# Patient Record
Sex: Female | Born: 1955
Health system: Southern US, Community
[De-identification: ages and names within clinical notes are randomized; demographics above are authoritative.]

## PROBLEM LIST (undated history)

## (undated) DIAGNOSIS — I10 Essential (primary) hypertension: Secondary | ICD-10-CM

## (undated) DIAGNOSIS — E119 Type 2 diabetes mellitus without complications: Secondary | ICD-10-CM

## (undated) DIAGNOSIS — F32A Depression, unspecified: Secondary | ICD-10-CM

## (undated) DIAGNOSIS — M1990 Unspecified osteoarthritis, unspecified site: Secondary | ICD-10-CM

## (undated) DIAGNOSIS — K219 Gastro-esophageal reflux disease without esophagitis: Secondary | ICD-10-CM

## (undated) DIAGNOSIS — E669 Obesity, unspecified: Secondary | ICD-10-CM

## (undated) DIAGNOSIS — J45909 Unspecified asthma, uncomplicated: Secondary | ICD-10-CM

## (undated) DIAGNOSIS — G4730 Sleep apnea, unspecified: Secondary | ICD-10-CM

## (undated) DIAGNOSIS — F419 Anxiety disorder, unspecified: Secondary | ICD-10-CM

## (undated) HISTORY — PX: SALPINGECTOMY: SHX328

## (undated) HISTORY — DX: Depression, unspecified: F32.A

## (undated) HISTORY — PX: LAPAROSCOPIC GASTRIC BANDING: SHX1100

## (undated) HISTORY — DX: Obesity, unspecified: E66.9

## (undated) HISTORY — DX: Type 2 diabetes mellitus without complications: E11.9

## (undated) HISTORY — DX: Gastro-esophageal reflux disease without esophagitis: K21.9

## (undated) HISTORY — PX: OTHER SURGICAL HISTORY: SHX169

## (undated) HISTORY — DX: Anxiety disorder, unspecified: F41.9

## (undated) HISTORY — PX: LEEP: SHX91

## (undated) HISTORY — DX: Unspecified osteoarthritis, unspecified site: M19.90

---

## 1999-08-13 ENCOUNTER — Encounter: Payer: Self-pay | Admitting: *Deleted

## 1999-08-13 ENCOUNTER — Ambulatory Visit (HOSPITAL_COMMUNITY): Admission: RE | Admit: 1999-08-13 | Discharge: 1999-08-13 | Payer: Self-pay | Admitting: *Deleted

## 2000-10-19 ENCOUNTER — Encounter: Payer: Self-pay | Admitting: Obstetrics and Gynecology

## 2000-10-19 ENCOUNTER — Inpatient Hospital Stay (HOSPITAL_COMMUNITY): Admission: AD | Admit: 2000-10-19 | Discharge: 2000-10-19 | Payer: Self-pay | Admitting: Obstetrics and Gynecology

## 2000-10-25 ENCOUNTER — Inpatient Hospital Stay (HOSPITAL_COMMUNITY): Admission: AD | Admit: 2000-10-25 | Discharge: 2000-10-25 | Payer: Self-pay | Admitting: Obstetrics and Gynecology

## 2000-10-29 ENCOUNTER — Encounter (INDEPENDENT_AMBULATORY_CARE_PROVIDER_SITE_OTHER): Payer: Self-pay | Admitting: Specialist

## 2000-10-29 ENCOUNTER — Inpatient Hospital Stay (HOSPITAL_COMMUNITY): Admission: AD | Admit: 2000-10-29 | Discharge: 2000-10-29 | Payer: Self-pay | Admitting: Obstetrics & Gynecology

## 2000-10-29 ENCOUNTER — Encounter: Payer: Self-pay | Admitting: Obstetrics and Gynecology

## 2000-10-29 ENCOUNTER — Ambulatory Visit (HOSPITAL_COMMUNITY): Admission: AD | Admit: 2000-10-29 | Discharge: 2000-10-29 | Payer: Self-pay | Admitting: Obstetrics and Gynecology

## 2011-04-29 NOTE — Op Note (Signed)
[facility]  Patient:    Monica Duarte, Monica Duarte                        MRN: [id number] Proc. Date: 10/29/00 Adm. Date:  [id number] Attending:  Trevor Iha                           Operative Report  PREOPERATIVE DIAGNOSIS:       Probable right ectopic pregnancy.  POSTOPERATIVE DIAGNOSIS:      Probable right ectopic pregnancy. Pelvic adhesive disease.  OPERATION:                    Laparoscopy with right salpingectomy and lysis of adhesions.  SURGEON:                      Trevor Iha, M.D.  ANESTHESIA:                   General endotracheal.  INDICATIONS:                  Ms. Monica Duarte is a 55 year old G2, P1 with known ectopic on the right. Received methotrexate twice, was diagnosed originally on October 15, 2000. Patient began sudden onset of pain approximately 02:30 this morning. She was seen in the emergency room and sent home. She presented again today about 12 noon with worsening pain on the right side, sudden and sharp. She also had vaginal bleeding. Hemoglobin was stable at 12.1 at triage. Beta HCG today was 7462 which is down since yesterday of 8600; however, because of the presentation on what ultrasound which showed a moderate amount of fluid in the cul-de-sac consistent with a probable right ectopic rupture. Planned laparoscopic evaluation and/or treatment of the probable right ectopic. Risks and benefits were discussed at length. Informed consent was obtained.  FINDINGS:                     The findings at the time of surgery were approximately 200 cc of blood within the abdomen, a right ectopic pregnancy which had ruptured, and also scar tissue from the appendix to the right ectopic. Normal-appearing left tube and ovary and uterus and normal- appearing gallbladder and liver.  DESCRIPTION OF PROCEDURE:     After adequate anesthesia the patient was placed in the dorsal lithotomy position. She was sterilely prepped and draped.  A one-cm infraumbilical skin incision was made. Voorhees needle was inserted. The abdomen was instillated to dullness percussion. Two 5-mm trocars were inserted to the left and right of the midline two fingerbreadths above the pubis symphysis under direct visualization. The above findings were noted. Because of the extensiveness of the tube and ectopic, after evacuation of large amount of clots, lysis of adhesions was performed using Endoshears with good hemostasis achieved. The appendix appeared to be normal after removal from the right tubo-ovarian complex. Because of the adhesions and the extensive nature of the ectopic, it was decided that right salpingectomy would be performed. The left tube did appear to be normal at this time. Bipolar cautery was used to cauterize across the fallopian tube, across the mesosalpinx with care taken to avoid left infudibulopelvic ligament and ovary and bowel. After careful dissection the right tube and ectopic were moved. Good hemostasis was noted. After copious amount of irrigation adequate hemostasis was achieved. Rexamination of the right mesosalpinx and right tube  showed good hemostasis. The appendix appeared to be normal. The tube was fragmented and removed through the 12-mm trocar. At this time the laparoscope was removed. The trocar sites were closed with 0 Vicryl in the infraumbilical skin incision and 3-0 Vicryl Rapide in subcuticular fashion. The 5-mm ports were closed with 3-0 Vicryl Rapide in subcuticular fashion. They were injected with 0.25% Marcaine. Tenaculum is removed from the cervix, noted to be hemostatic.  The patient tolerated the procedure well with stable transfer to recovery room. Sponge and instrument count was normal x 3.  DISPOSITION:                  Patient to be discharged home with a routine instruction sheet for laparoscopy. She is told to return for increased pain, bleeding, or fever. Otherwise, she will follow-up in  the office in two to three weeks. DD:  10/29/00 TD:  10/29/00 Job: [id number]

## 2012-01-16 ENCOUNTER — Ambulatory Visit (INDEPENDENT_AMBULATORY_CARE_PROVIDER_SITE_OTHER): Payer: 59 | Admitting: Internal Medicine

## 2012-01-16 VITALS — BP 108/78 | HR 65 | Temp 97.9°F | Resp 16 | Ht 66.0 in | Wt 238.0 lb

## 2012-01-16 DIAGNOSIS — M545 Low back pain: Secondary | ICD-10-CM

## 2012-01-16 DIAGNOSIS — N72 Inflammatory disease of cervix uteri: Secondary | ICD-10-CM

## 2012-01-16 DIAGNOSIS — N760 Acute vaginitis: Secondary | ICD-10-CM

## 2012-01-16 LAB — POCT UA - MICROSCOPIC ONLY
Bacteria, U Microscopic: NEGATIVE
Casts, Ur, LPF, POC: NEGATIVE
Crystals, Ur, HPF, POC: NEGATIVE
Mucus, UA: NEGATIVE
Yeast, UA: NEGATIVE

## 2012-01-16 LAB — POCT URINALYSIS DIPSTICK
Bilirubin, UA: NEGATIVE
Glucose, UA: NEGATIVE
Ketones, UA: NEGATIVE
Nitrite, UA: NEGATIVE
Spec Grav, UA: 1.02
Urobilinogen, UA: 0.2
pH, UA: 5.5

## 2012-01-16 LAB — POCT WET PREP WITH KOH
KOH Prep POC: NEGATIVE
RBC Wet Prep HPF POC: NEGATIVE
Trichomonas, UA: NEGATIVE
Yeast Wet Prep HPF POC: NEGATIVE

## 2012-01-16 LAB — POCT URINE PREGNANCY: Preg Test, Ur: NEGATIVE

## 2012-01-16 MED ORDER — MELOXICAM 7.5 MG PO TABS: 7.5000 mg | ORAL_TABLET | Freq: Every day | ORAL | Status: DC

## 2012-01-16 NOTE — Progress Notes (Signed)
  Subjective:    Patient ID: Monica Duarte, female    DOB: [birth year], 56 y.o.   MRN: [id number]  Vaginal Discharge The patient's primary symptoms include a vaginal discharge. This is a new problem. The current episode started yesterday. The problem occurs intermittently. The problem has been unchanged. The patient is experiencing no pain. She is not pregnant. Associated symptoms include back pain. Pertinent negatives include no abdominal pain, anorexia, nausea, painful intercourse or urgency. The vaginal discharge was yellow. The symptoms are aggravated by nothing. She has tried nothing for the symptoms. It is unknown whether or not her partner has an STD. She uses condoms for contraception. Her menstrual history has been regular. Her past medical history is significant for an STD.  Monica Duarte also has intermittent low back pain, this is not related to her vaginal discharge complaint today.  Usually it is relieved with 800 mg Motrin.  She had L/S films last January at [facility].    Review of Systems  Gastrointestinal: Negative for nausea, abdominal pain and anorexia.  Genitourinary: Positive for vaginal discharge. Negative for urgency.  Musculoskeletal: Positive for back pain.       Objective:   Physical Exam  Constitutional: She is oriented to person, place, and time. She appears well-developed and well-nourished.  HENT:  Head: Normocephalic.  Eyes: Conjunctivae are normal.  Neck: Neck supple.  Cardiovascular: Normal rate, regular rhythm and normal heart sounds.   Pulmonary/Chest: Effort normal and breath sounds normal.  Musculoskeletal: Normal range of motion. She exhibits no edema and no tenderness.       No paraspinal muscle tenderness in her back.  Neurological: She is alert and oriented to person, place, and time. She displays normal reflexes.  Skin: Skin is warm and dry.  Psychiatric: She has a normal mood and affect.          Assessment & Plan:  Low back strain likely  secondary to chronic MS strain/body mechanics.  Continue weight loss efforts.  Restart back exercises from booklet given by A. McClung several months ago.  Meloxicam prn for pain.  No sign of urinary or bacterial infections today.

## 2012-01-16 NOTE — Patient Instructions (Signed)
Take meloxicam for back discomfort, restart back exercises for strengthening.  If back pain persists will refer to orthopedics/PT.  RTC prn.

## 2012-01-26 ENCOUNTER — Telehealth: Payer: Self-pay

## 2012-01-26 NOTE — Telephone Encounter (Signed)
Pt states she would like to speak with [place] ONLY about recent ov. States she saw diff. Provider recently who 'disagreed with what angela talked about and would not refill rx for diet pills'.  Please call: 2058/08/20 bf

## 2012-01-26 NOTE — Telephone Encounter (Signed)
Please get me this patients chart to go with the msg. Thanks, Doris Mcgilvery

## 2012-01-27 NOTE — Telephone Encounter (Signed)
Monica Duarte, Chart in your box

## 2012-01-27 NOTE — Telephone Encounter (Signed)
Please call patient and tell her I am no longer prescribing this medication.  Thanks, angela

## 2012-01-28 NOTE — Telephone Encounter (Signed)
[phone] 

## 2012-01-28 NOTE — Telephone Encounter (Signed)
Spoke with pt and she would like to know why this med cannot be prescribed anymore. She would like Monica Duarte to call her back and if she is not available she can leave message on her VM. Call pt at

## 2012-02-03 NOTE — Telephone Encounter (Signed)
Monica Duarte, it looks like this needs to be routed to you

## 2012-04-16 ENCOUNTER — Encounter (HOSPITAL_COMMUNITY): Payer: Self-pay | Admitting: *Deleted

## 2012-04-16 ENCOUNTER — Emergency Department (HOSPITAL_COMMUNITY)
Admission: EM | Admit: 2012-04-16 | Discharge: 2012-04-16 | Disposition: A | Discharge status: Home / Self Care | Payer: 59 | Source: Home / Self Care | Attending: Emergency Medicine | Admitting: Emergency Medicine

## 2012-04-16 DIAGNOSIS — T192XXA Foreign body in vulva and vagina, initial encounter: Secondary | ICD-10-CM

## 2012-04-16 LAB — POCT URINALYSIS DIP (DEVICE)
Nitrite: NEGATIVE
Protein, ur: NEGATIVE mg/dL
Specific Gravity, Urine: 1.015 (ref 1.005–1.030)
Urobilinogen, UA: 0.2 mg/dL (ref 0.0–1.0)
pH: 6 (ref 5.0–8.0)

## 2012-04-16 MED ORDER — AZITHROMYCIN 250 MG PO TABS
1000.0000 mg | ORAL_TABLET | Freq: Once | ORAL | Status: AC
  Administered 2012-04-16: 1000 mg via ORAL

## 2012-04-16 MED ORDER — CEFTRIAXONE SODIUM 250 MG IJ SOLR
250.0000 mg | Freq: Once | INTRAMUSCULAR | Status: AC
  Administered 2012-04-16: 250 mg via INTRAMUSCULAR

## 2012-04-16 MED ORDER — AZITHROMYCIN 250 MG PO TABS
ORAL_TABLET | ORAL | Status: AC
  Filled 2012-04-16: qty 4

## 2012-04-16 MED ORDER — CEFTRIAXONE SODIUM 250 MG IJ SOLR
INTRAMUSCULAR | Status: AC
  Filled 2012-04-16: qty 250

## 2012-04-16 NOTE — ED Provider Notes (Signed)
History     CSN: [id number]  Arrival date & time 04/16/12  1254   First MD Initiated Contact with Patient 04/16/12 1258      Chief Complaint  Patient presents with  . Vaginal Discharge    (Consider location/radiation/quality/duration/timing/severity/associated sxs/prior treatment) HPI Comments: Pt with several days oderous, brownish vaginal discharge. Reports vaginal bleeding post intercourse last night. She has been using tampons for the past 10 days, but states that she is taking them out regularly. She used a tampon after she started bleeding last night, but states she took about before being evaluated today. No urgency, frequency, dysuria, oderous urine, hematuria,  genital blisters, vaginal itching. No fevers, N/V, abd pain, back pain. No rash, presyncope, syncope. No recent abx use. Pt sexually active with same female partner who is asxatic. Does use does not use condoms. STD's a concern today. Has history of trichomonas. No history of  BV gonorrhea chlamydia  yeast infection. No h/o syphilis, herpes, HIV.  ROS as noted in HPI. All other ROS negative.   Patient is a 56 y.o. female presenting with vaginal discharge. The history is provided by the patient. No language interpreter was used.  Vaginal Discharge This is a new problem. The current episode started more than 2 days ago. The problem occurs constantly. The problem has not changed since onset.Pertinent negatives include no abdominal pain. The symptoms are aggravated by intercourse. The symptoms are relieved by nothing. She has tried nothing for the symptoms. The treatment provided no relief.    Past Medical History  Diagnosis Date  . Obesity   . Trichomonal vaginitis     Past Surgical History  Procedure Date  . Laparoscopic gastric banding     2009    History reviewed. No pertinent family history.  History  Substance Use Topics  . Smoking status: Never Smoker   . Smokeless tobacco: Not on file  . Alcohol Use: Yes     OB History    Grav Para Term Preterm Abortions TAB SAB Ect Mult Living                  Review of Systems  Gastrointestinal: Negative for abdominal pain.  Genitourinary: Positive for vaginal discharge.    Allergies  Sulfa antibiotics  Home Medications   Current Outpatient Rx  Name Route Sig Dispense Refill  . MELOXICAM 7.5 MG PO TABS Oral Take 1 tablet (7.5 mg total) by mouth daily. 60 tablet 0  . MULTIVITAMINS PO CAPS Oral Take 1 capsule by mouth daily.      BP 118/53  Pulse 78  Temp(Src) 98.5 F (36.9 C) (Oral)  Resp 20  SpO2 100%  LMP 03/31/2012  Physical Exam  Nursing note and vitals reviewed. Constitutional: She is oriented to person, place, and time. She appears well-developed and well-nourished. No distress.  HENT:  Head: Normocephalic and atraumatic.  Eyes: EOM are normal.  Neck: Normal range of motion.  Cardiovascular: Normal rate.   Pulmonary/Chest: Effort normal.  Abdominal: Soft. Bowel sounds are normal. She exhibits no distension. There is no tenderness. There is no rebound and no guarding.  Genitourinary: Uterus normal. Pelvic exam was performed with patient supine. There is no rash on the right labia. There is no rash on the left labia. Uterus is not tender. Cervix exhibits no motion tenderness and no friability. Right adnexum displays no mass, no tenderness and no fullness. Left adnexum displays no mass, no tenderness and no fullness. There is bleeding around the vagina. No  erythema or tenderness around the vagina. There is a foreign body around the vagina. Vaginal discharge found.       Retained tampon in vagina.. (+) vaginal odor. Chaperone present during exam  Musculoskeletal: Normal range of motion.  Neurological: She is alert and oriented to person, place, and time.  Skin: Skin is warm and dry. No rash noted.  Psychiatric: She has a normal mood and affect. Her behavior is normal. Judgment and thought content normal.    ED Course  Procedures  (including critical care time)  Labs Reviewed  POCT URINALYSIS DIP (DEVICE) - Abnormal; Notable for the following:    Hgb urine dipstick MODERATE (*)    All other components within normal limits  POCT PREGNANCY, URINE  WET PREP, GENITAL  GC/CHLAMYDIA PROBE AMP, GENITAL   No results found.   1. Foreign body in vagina    Results for orders placed during the hospital encounter of 04/16/12  POCT URINALYSIS DIP (DEVICE)      Component Value Range   Glucose, UA NEGATIVE  NEGATIVE (mg/dL)   Bilirubin Urine NEGATIVE  NEGATIVE    Ketones, ur NEGATIVE  NEGATIVE (mg/dL)   Specific Gravity, Urine 1.015  1.005 - 1.030    Hgb urine dipstick MODERATE (*) NEGATIVE    pH 6.0  5.0 - 8.0    Protein, ur NEGATIVE  NEGATIVE (mg/dL)   Urobilinogen, UA 0.2  0.0 - 1.0 (mg/dL)   Nitrite NEGATIVE  NEGATIVE    Leukocytes, UA NEGATIVE  NEGATIVE   POCT PREGNANCY, URINE      Component Value Range   Preg Test, Ur NEGATIVE  NEGATIVE       MDM  H&P most c/w retained vaginal body. Removed the tampon intact. No FB left behind. Os normal. scant bleeding post GC specimen collection.. This has been present in the vagina for an unknown amount of time. Patient is not tachycardic febrile, or hypotensive. There is no history of rash, fevers. No evidence of TSS.  Sent off GC/chlamydia, wet prep. Will  treat empirically now per patient request. Giving ceftriaxone 250 mg IM/azithro 1 gm po.  Advised pt to refrain from sexual contact until she knows lab results, symptoms resolve, and partner(s) are treated. Pt provided working phone number. Pt agrees.     Luiz Blare, MD 04/16/12 [phone]

## 2012-04-16 NOTE — ED Notes (Signed)
Pt  Has  Symptoms  Of  Vaginal  Discharge   With  Some  Irritation   For  About 1  Week    She  denys  Any  Bleeding or  Any  Cramping  Pain at this  Time -     Pt  Is  Ambulatory to  Exam room in an upright  Fluid  Fashion and  Appears  In no acute  Distress

## 2012-04-16 NOTE — ED Notes (Signed)
Discussed wet prep with Dr. Chaney Malling.  No further action needed. States her odor and discharge were probably from  retained tampon.  Monica Duarte 04/16/2012

## 2012-04-16 NOTE — ED Notes (Signed)
Tampon retrieved during pelvic exam

## 2012-04-16 NOTE — Discharge Instructions (Signed)
Go to www.goodrx.com to look up your medications. This will give you a list of where you can find your prescriptions at the most affordable prices.   RESOURCE GUIDE  No Primary Care Doctor Call [facility]  [phone] Other agencies that provide inexpensive medical care    [facility]  [phone]    [facility]  [phone]    [facility]  [phone]    [facility]  [phone] [place] [address]    [facility]  [phone]    [facility]  [phone] [facility] [phone]   [address] [place], [place] [address]  [place] [facility]  [facility] [place] [facility]     [employer]                          [facility] [address] [place]                       [address]      [address]   [phone] (After Hours)   

## 2012-04-17 LAB — GC/CHLAMYDIA PROBE AMP, GENITAL
Chlamydia, DNA Probe: NEGATIVE
GC Probe Amp, Genital: NEGATIVE

## 2012-05-23 ENCOUNTER — Other Ambulatory Visit: Payer: Self-pay | Admitting: Obstetrics and Gynecology

## 2012-05-23 ENCOUNTER — Other Ambulatory Visit (HOSPITAL_COMMUNITY)
Admission: RE | Admit: 2012-05-23 | Discharge: 2012-05-23 | Disposition: A | Discharge status: Home / Self Care | Payer: 59 | Source: Ambulatory Visit | Attending: Obstetrics and Gynecology | Admitting: Obstetrics and Gynecology

## 2012-05-23 DIAGNOSIS — N760 Acute vaginitis: Secondary | ICD-10-CM | POA: Insufficient documentation

## 2012-05-23 DIAGNOSIS — Z113 Encounter for screening for infections with a predominantly sexual mode of transmission: Secondary | ICD-10-CM | POA: Insufficient documentation

## 2012-05-23 DIAGNOSIS — Z01419 Encounter for gynecological examination (general) (routine) without abnormal findings: Secondary | ICD-10-CM | POA: Insufficient documentation

## 2012-06-04 ENCOUNTER — Other Ambulatory Visit: Payer: Self-pay | Admitting: Obstetrics and Gynecology

## 2012-10-14 ENCOUNTER — Emergency Department (HOSPITAL_COMMUNITY)
Admission: EM | Admit: 2012-10-14 | Discharge: 2012-10-14 | Disposition: A | Discharge status: Home / Self Care | Payer: 59 | Attending: Emergency Medicine | Admitting: Emergency Medicine

## 2012-10-14 ENCOUNTER — Encounter (HOSPITAL_COMMUNITY): Payer: Self-pay

## 2012-10-14 DIAGNOSIS — E669 Obesity, unspecified: Secondary | ICD-10-CM | POA: Insufficient documentation

## 2012-10-14 DIAGNOSIS — Z8619 Personal history of other infectious and parasitic diseases: Secondary | ICD-10-CM | POA: Insufficient documentation

## 2012-10-14 DIAGNOSIS — R509 Fever, unspecified: Secondary | ICD-10-CM | POA: Insufficient documentation

## 2012-10-14 DIAGNOSIS — H9209 Otalgia, unspecified ear: Secondary | ICD-10-CM | POA: Insufficient documentation

## 2012-10-14 DIAGNOSIS — J029 Acute pharyngitis, unspecified: Secondary | ICD-10-CM

## 2012-10-14 DIAGNOSIS — R51 Headache: Secondary | ICD-10-CM | POA: Insufficient documentation

## 2012-10-14 MED ORDER — NAPROXEN 500 MG PO TABS: 500.0000 mg | ORAL_TABLET | Freq: Two times a day (BID) | ORAL | Status: DC

## 2012-10-14 MED ORDER — HYDROCODONE-ACETAMINOPHEN 7.5-325 MG/15ML PO SOLN: 15.0000 mL | Freq: Three times a day (TID) | ORAL | Status: DC | PRN

## 2012-10-14 MED ORDER — KETOROLAC TROMETHAMINE 60 MG/2ML IM SOLN
60.0000 mg | Freq: Once | INTRAMUSCULAR | Status: AC
  Administered 2012-10-14: 60 mg via INTRAMUSCULAR
  Filled 2012-10-14: qty 2

## 2012-10-14 NOTE — ED Provider Notes (Signed)
History     CSN: [id number]  Arrival date & time 10/14/12  0501   First MD Initiated Contact with Patient 10/14/12 0536      Chief Complaint  Patient presents with  . Sore Throat    (Consider location/radiation/quality/duration/timing/severity/associated sxs/prior treatment) HPI Comments: Pt states that she has had gradual onset of sore throat X 4 days - gradually worsening, assocaited with earache, frontal headache and some mucus production.  She denies fevers, abd pain or blurred vision.  Nothing makes better, worse with swallowing.    Patient is a 56 y.o. female presenting with pharyngitis. The history is provided by the patient.  Sore Throat Associated symptoms include headaches.    Past Medical History  Diagnosis Date  . Obesity   . Trichomonal vaginitis     Past Surgical History  Procedure Date  . Laparoscopic gastric banding     2009    No family history on file.  History  Substance Use Topics  . Smoking status: Never Smoker   . Smokeless tobacco: Not on file  . Alcohol Use: Yes    OB History    Grav Para Term Preterm Abortions TAB SAB Ect Mult Living                  Review of Systems  Constitutional: Positive for fever ( subjective). Negative for chills.  HENT: Positive for sore throat. Negative for ear pain.   Eyes: Negative for discharge and redness.  Respiratory: Negative for cough.   Gastrointestinal: Negative for nausea, vomiting and diarrhea.  Genitourinary: Negative for dysuria.  Musculoskeletal: Negative for back pain and joint swelling.  Skin: Negative for rash.  Neurological: Positive for headaches.  Hematological: Negative for adenopathy.    Allergies  Sulfa antibiotics  Home Medications   Current Outpatient Rx  Name  Route  Sig  Dispense  Refill  . HYDROCODONE-ACETAMINOPHEN 7.5-325 MG/15ML PO SOLN   Oral   Take 15 mLs by mouth every 8 (eight) hours as needed for pain.   120 mL   0   . NAPROXEN 500 MG PO TABS   Oral  Take 1 tablet (500 mg total) by mouth 2 (two) times daily with a meal.   30 tablet   0     BP 134/80  Pulse 92  Temp 98.6 F (37 C) (Oral)  Resp 16  SpO2 100%  LMP 08/26/2012  Physical Exam  Nursing note and vitals reviewed. Constitutional: She appears well-developed and well-nourished. No distress.  HENT:  Head: Normocephalic and atraumatic.       Bilateral erythema of the tonsils and pharynx, no asymetry but has exudate R>L.  Tongue normal and MMM  Eyes: Conjunctivae normal are normal. No scleral icterus.  Neck: Normal range of motion. Neck supple.  Cardiovascular: Normal rate and regular rhythm.   Pulmonary/Chest: Effort normal and breath sounds normal. No respiratory distress. She has no wheezes. She has no rales.  Lymphadenopathy:    She has no cervical adenopathy.  Neurological: She is alert. Coordination normal.  Skin: Skin is warm and dry. No rash noted. She is not diaphoretic.    ED Course  Procedures (including critical care time)   Labs Reviewed  RAPID STREP SCREEN   No results found.   1. Pharyngitis       MDM  Strep swab neg for strep, likely has viral process - will need symptomatic meds only - toradol given, home with naprosyn and hydrocodone suspension.  No asymetry or peritonsillar  abscess and airway is patent at this time.        Vida Roller, MD 10/14/12 [phone]

## 2012-10-14 NOTE — ED Notes (Signed)
Patient presents with sore throat since Friday 10-12-12. Redness noted to back of throat, patient also reports she's been "spitting up mucus".

## 2012-10-14 NOTE — ED Notes (Signed)
Rx x 2.  Pt voiced understanding to f/u with PCP and return for worsening condition.  

## 2013-01-17 ENCOUNTER — Emergency Department (HOSPITAL_COMMUNITY)
Admission: EM | Admit: 2013-01-17 | Discharge: 2013-01-17 | Disposition: A | Discharge status: Home / Self Care | Payer: 59 | Attending: Emergency Medicine | Admitting: Emergency Medicine

## 2013-01-17 ENCOUNTER — Encounter (HOSPITAL_COMMUNITY): Payer: Self-pay | Admitting: *Deleted

## 2013-01-17 DIAGNOSIS — Z8619 Personal history of other infectious and parasitic diseases: Secondary | ICD-10-CM | POA: Insufficient documentation

## 2013-01-17 DIAGNOSIS — E669 Obesity, unspecified: Secondary | ICD-10-CM | POA: Insufficient documentation

## 2013-01-17 DIAGNOSIS — Z8739 Personal history of other diseases of the musculoskeletal system and connective tissue: Secondary | ICD-10-CM | POA: Insufficient documentation

## 2013-01-17 DIAGNOSIS — M545 Low back pain, unspecified: Secondary | ICD-10-CM | POA: Insufficient documentation

## 2013-01-17 DIAGNOSIS — M549 Dorsalgia, unspecified: Secondary | ICD-10-CM

## 2013-01-17 MED ORDER — NAPROXEN 500 MG PO TABS: 500.0000 mg | ORAL_TABLET | Freq: Two times a day (BID) | ORAL | Status: DC

## 2013-01-17 NOTE — ED Provider Notes (Signed)
Medical screening examination/treatment/procedure(s) were performed by non-physician practitioner and as supervising physician I was immediately available for consultation/collaboration.  John-Adam Nel Stoneking, M.D.     John-Adam Kalyn Hofstra, MD 01/17/13 0701 

## 2013-01-17 NOTE — ED Notes (Signed)
MD at bedside. 

## 2013-01-17 NOTE — ED Notes (Signed)
Pt reports chronic lower back pain. States that pain was unbearable around 2200 last night. States that she was on her way to work when she had the back pain. 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 Pt has been taking ibuprofen 600mg  for the pain but did not take any yesterday.

## 2013-01-17 NOTE — ED Provider Notes (Signed)
History     CSN: [id number]  Arrival date & time 01/17/13  0012   First MD Initiated Contact with Patient 01/17/13 0155      Chief Complaint  Patient presents with  . Back Pain   HPI  History provided by the patient. Patient is a 57 year old female with history of obesity who presents with complaints of worsened low back pain. She reports having a history of low back pains have been intermittent for the past one to 2 years. Today she had similar pains but had a brief episodes of sharper stronger pains than usual. These lasted several minutes and at times up to 30 minutes. She took some ibuprofen but this did not change symptoms significantly. Pain did not radiate. In the past she was evaluated for low back pains with x-rays and was told she had arthritis in her spine. She denies any other associated symptoms. Denies any urinary complaints. Denies any flank pain. Denies any nausea vomiting. Denies any weakness or numbness in lower extremities. Denies any urinary or fecal incontinence, urinary retention or perineal numbness. Pain is worse was talking and some movements. No other aggravating or alleviating factors. No other associated symptoms.    Past Medical History  Diagnosis Date  . Obesity   . Trichomonal vaginitis     Past Surgical History  Procedure Date  . Laparoscopic gastric banding     2009    No family history on file.  History  Substance Use Topics  . Smoking status: Never Smoker   . Smokeless tobacco: Not on file  . Alcohol Use: Yes    OB History    Grav Para Term Preterm Abortions TAB SAB Ect Mult Living                  Review of Systems  Constitutional: Negative for fever and unexpected weight change.  Genitourinary: Negative for dysuria, frequency, hematuria and flank pain.  Musculoskeletal: Positive for back pain.  Neurological: Negative for weakness and numbness.  All other systems reviewed and are negative.    Allergies  Sulfa  antibiotics  Home Medications  No current outpatient prescriptions on file.  BP 122/71  Pulse 78  Temp 98.3 F (36.8 C) (Oral)  Resp 20  SpO2 100%  Physical Exam  Nursing note and vitals reviewed. Constitutional: She is oriented to person, place, and time. She appears well-developed and well-nourished. No distress.  HENT:  Head: Normocephalic.  Cardiovascular: Normal rate and regular rhythm.   Pulmonary/Chest: Effort normal and breath sounds normal. No respiratory distress. She has no wheezes.  Abdominal: Soft. There is no tenderness. There is no rebound and no guarding.       No CVA tenderness  Musculoskeletal: Normal range of motion.       Lumbar back: She exhibits tenderness.       Back:       Tenderness is mild  Neurological: She is alert and oriented to person, place, and time.  Skin: Skin is warm and dry. No rash noted.  Psychiatric: She has a normal mood and affect. Her behavior is normal.    ED Course  Procedures      1. Back pain       MDM  2:10AM patient seen and evaluated. Patient well-appearing in no acute distress. She has no concerning or red flag symptoms for back pain. No indications for imaging at this time.        Angus Seller, [place] 01/17/13 [phone]

## 2013-02-20 ENCOUNTER — Ambulatory Visit: Payer: 59 | Admitting: Family Medicine

## 2014-02-27 ENCOUNTER — Emergency Department (HOSPITAL_COMMUNITY): Payer: 59

## 2014-02-27 ENCOUNTER — Emergency Department (HOSPITAL_COMMUNITY)
Admission: EM | Admit: 2014-02-27 | Discharge: 2014-02-27 | Disposition: A | Discharge status: Home / Self Care | Payer: 59 | Attending: Emergency Medicine | Admitting: Emergency Medicine

## 2014-02-27 ENCOUNTER — Encounter (HOSPITAL_COMMUNITY): Payer: Self-pay | Admitting: Emergency Medicine

## 2014-02-27 DIAGNOSIS — Z791 Long term (current) use of non-steroidal anti-inflammatories (NSAID): Secondary | ICD-10-CM | POA: Insufficient documentation

## 2014-02-27 DIAGNOSIS — M545 Low back pain, unspecified: Secondary | ICD-10-CM | POA: Insufficient documentation

## 2014-02-27 DIAGNOSIS — Z8619 Personal history of other infectious and parasitic diseases: Secondary | ICD-10-CM | POA: Insufficient documentation

## 2014-02-27 DIAGNOSIS — M25559 Pain in unspecified hip: Secondary | ICD-10-CM | POA: Insufficient documentation

## 2014-02-27 DIAGNOSIS — E669 Obesity, unspecified: Secondary | ICD-10-CM | POA: Insufficient documentation

## 2014-02-27 DIAGNOSIS — M25551 Pain in right hip: Secondary | ICD-10-CM

## 2014-02-27 MED ORDER — TRAMADOL HCL 50 MG PO TABS: 50.0000 mg | ORAL_TABLET | Freq: Four times a day (QID) | ORAL | Status: DC | PRN

## 2014-02-27 NOTE — ED Notes (Signed)
Pt c/o lower back pain that she is taking pain medicine for.  Pt c/o R hip pain that radiates to R knee.  Laying down increases pain.

## 2014-02-27 NOTE — ED Provider Notes (Signed)
CSN: [id number]     Arrival date & time 02/27/14  1215 History  This chart was scribed for non-physician practitioner, Sharilyn Sites, PA-C working with Juliet Rude. Rubin Payor, MD by Greggory Stallion, ED scribe. This patient was seen in room TR09C/TR09C and the patient's care was started at 12:59 PM.   Chief Complaint  Patient presents with  . Leg Pain  . Back Pain   The history is provided by the patient. No language interpreter was used.   HPI Comments: Monica Duarte is a 58 y.o. female who presents to the Emergency Department complaining of gradual onset, constant right hip pain that radiates into her knee that started a couple of days ago. Denies recent falls or trauma to the hip. Bearing weight and ambulation worsen the pain. No gait disturbance.  Denies history of right hip pain.  No prior hip surgeries.  Denies numbness, paresthesias, or weakness of right leg.  Pt has been taking anti-inflammatories without noted improvement.  Past Medical History  Diagnosis Date  . Obesity   . Trichomonal vaginitis    Past Surgical History  Procedure Laterality Date  . Laparoscopic gastric banding      2009   No family history on file. History  Substance Use Topics  . Smoking status: Never Smoker   . Smokeless tobacco: Not on file  . Alcohol Use: Yes     Comment: occ   OB History   Grav Para Term Preterm Abortions TAB SAB Ect Mult Living                 Review of Systems  Musculoskeletal: Positive for arthralgias and myalgias.  All other systems reviewed and are negative.   Allergies  Sulfa antibiotics  Home Medications   Current Outpatient Rx  Name  Route  Sig  Dispense  Refill  . naproxen (NAPROSYN) 500 MG tablet   Oral   Take 1 tablet (500 mg total) by mouth 2 (two) times daily.   30 tablet   0    BP 122/68  Pulse 77  Temp(Src) 97.4 F (36.3 C) (Oral)  Resp 20  Ht 5' 5.5" (1.664 m)  Wt 259 lb 4.8 oz (117.618 kg)  BMI 42.48 kg/m2  SpO2 100%  LMP  02/09/2014  Physical Exam  Nursing note and vitals reviewed. Constitutional: She is oriented to person, place, and time. She appears well-developed and well-nourished.  HENT:  Head: Normocephalic and atraumatic.  Mouth/Throat: Oropharynx is clear and moist.  Eyes: Conjunctivae and EOM are normal. Pupils are equal, round, and reactive to light.  Neck: Normal range of motion.  Cardiovascular: Normal rate, regular rhythm and normal heart sounds.   Pulmonary/Chest: Effort normal and breath sounds normal.  Abdominal: Soft. Bowel sounds are normal.  Musculoskeletal: Normal range of motion.  Right hip non tender. Pain with flexion and extension. Full ROM of knee and ankle. Distal sensation intact. Ambulating unassisted without difficulty  Neurological: She is alert and oriented to person, place, and time.  Skin: Skin is warm and dry.  Psychiatric: She has a normal mood and affect.    ED Course  Procedures (including critical care time)  DIAGNOSTIC STUDIES: Oxygen Saturation is 100% on RA, normal by my interpretation.    COORDINATION OF CARE: 1:00 PM-Discussed treatment plan which includes xray with pt at bedside and pt agreed to plan.   Labs Review Labs Reviewed - No data to display Imaging Review Dg Hip Complete Right  02/27/2014   CLINICAL DATA:  Pain  EXAM: RIGHT HIP - COMPLETE 2+ VIEW  COMPARISON:  None.  FINDINGS: Frontal pelvis as well as frontal and lateral right hip images were obtained. There is slight symmetric narrowing of both hip joints. No fracture or dislocation. No erosive change.  IMPRESSION: Slight symmetric narrowing of both hip joints.   Electronically Signed   By: Bretta BangWilliam  Woodruff M.D.   On: 02/27/2014 13:32     EKG Interpretation None      MDM   Final diagnoses:  Hip pain, right   HA negative for acute fracture or dislocation  Sx likely due to OA. Patient was started on tramadol. She will follow with her primary care physician if symptoms persist.   Discussed plan with pt, they agreed.  Return precautions advised.  I personally performed the services described in this documentation, which was scribed in my presence. The recorded information has been reviewed and is accurate.  Garlon HatchetLisa M Klaire Court, PA-C 02/27/14 [phone]

## 2014-02-27 NOTE — Discharge Instructions (Signed)
Take the prescribed medication as directed.  May take with tylenol for added relief. Follow-up with your primary care physician if problems occur. Return to the ED for new or worsening symptoms.

## 2014-02-28 NOTE — ED Provider Notes (Signed)
Medical screening examination/treatment/procedure(s) were performed by non-physician practitioner and as supervising physician I was immediately available for consultation/collaboration.   EKG Interpretation None       Arvid Marengo R. Artist Bloom, MD 02/28/14 0647 

## 2014-05-22 DIAGNOSIS — Z0271 Encounter for disability determination: Secondary | ICD-10-CM

## 2014-08-31 ENCOUNTER — Encounter (HOSPITAL_COMMUNITY): Payer: Self-pay | Admitting: Emergency Medicine

## 2014-08-31 ENCOUNTER — Emergency Department (HOSPITAL_COMMUNITY)
Admission: EM | Admit: 2014-08-31 | Discharge: 2014-08-31 | Disposition: A | Discharge status: Home / Self Care | Payer: 59 | Attending: Emergency Medicine | Admitting: Emergency Medicine

## 2014-08-31 DIAGNOSIS — N760 Acute vaginitis: Secondary | ICD-10-CM | POA: Insufficient documentation

## 2014-08-31 DIAGNOSIS — B9689 Other specified bacterial agents as the cause of diseases classified elsewhere: Secondary | ICD-10-CM | POA: Insufficient documentation

## 2014-08-31 DIAGNOSIS — Z79899 Other long term (current) drug therapy: Secondary | ICD-10-CM | POA: Insufficient documentation

## 2014-08-31 DIAGNOSIS — Z8619 Personal history of other infectious and parasitic diseases: Secondary | ICD-10-CM | POA: Insufficient documentation

## 2014-08-31 DIAGNOSIS — N898 Other specified noninflammatory disorders of vagina: Secondary | ICD-10-CM | POA: Insufficient documentation

## 2014-08-31 DIAGNOSIS — A499 Bacterial infection, unspecified: Secondary | ICD-10-CM | POA: Insufficient documentation

## 2014-08-31 DIAGNOSIS — E669 Obesity, unspecified: Secondary | ICD-10-CM | POA: Insufficient documentation

## 2014-08-31 LAB — WET PREP, GENITAL
Clue Cells Wet Prep HPF POC: NONE SEEN
Yeast Wet Prep HPF POC: NONE SEEN

## 2014-08-31 MED ORDER — METRONIDAZOLE 500 MG PO TABS
2000.0000 mg | ORAL_TABLET | Freq: Once | ORAL | Status: AC
  Administered 2014-08-31: 2000 mg via ORAL
  Filled 2014-08-31: qty 4

## 2014-08-31 MED ORDER — CEFTRIAXONE SODIUM 250 MG IJ SOLR
250.0000 mg | Freq: Once | INTRAMUSCULAR | Status: AC
  Administered 2014-08-31: 250 mg via INTRAMUSCULAR
  Filled 2014-08-31: qty 250

## 2014-08-31 MED ORDER — AZITHROMYCIN 250 MG PO TABS
1000.0000 mg | ORAL_TABLET | Freq: Once | ORAL | Status: AC
  Administered 2014-08-31: 1000 mg via ORAL
  Filled 2014-08-31: qty 4

## 2014-08-31 MED ORDER — STERILE WATER FOR INJECTION IJ SOLN
INTRAMUSCULAR | Status: DC
Start: 2014-08-31 — End: 2014-08-31
  Filled 2014-08-31: qty 10

## 2014-08-31 NOTE — ED Notes (Signed)
Pt states yellow colored vaginal discharge, ongoing x1 month. Denies abdominal pain. Denies urinary symptoms. Pt is alert and oriented x4. NAD.

## 2014-08-31 NOTE — ED Provider Notes (Signed)
CSN: [id number]     Arrival date & time 08/31/14  4098 History   First MD Initiated Contact with Patient 08/31/14 [phone]     Chief Complaint  Patient presents with  . Vaginal Discharge     (Consider location/radiation/quality/duration/timing/severity/associated sxs/prior Treatment) Patient is a 58 y.o. female presenting with vaginal discharge. The history is provided by the patient.  Vaginal Discharge Associated symptoms: no abdominal pain, no dysuria, no fever, no nausea and no vomiting   pt c/o yellowish vaginal discharge for the past month. Symptoms persistent/constant, without acute or abrupt change today. No vaginal pain. Denies any abdominal or pelvic pain. No fever or chills. No dysuria. Post-menopausal, although states has intermittent/rare vaginal spotting, none currently. No known std exposure. No vaginal fb, states has used tampons in past w vaginal spotting, although none recently.     Past Medical History  Diagnosis Date  . Obesity   . Trichomonal vaginitis    Past Surgical History  Procedure Laterality Date  . Laparoscopic gastric banding      2009   History reviewed. No pertinent family history. History  Substance Use Topics  . Smoking status: Never Smoker   . Smokeless tobacco: Not on file  . Alcohol Use: Yes     Comment: occ   OB History   Grav Para Term Preterm Abortions TAB SAB Ect Mult Living                 Review of Systems  Constitutional: Negative for fever and chills.  HENT: Negative for sore throat.   Eyes: Negative for redness.  Respiratory: Negative for shortness of breath.   Cardiovascular: Negative for chest pain.  Gastrointestinal: Negative for nausea, vomiting and abdominal pain.  Genitourinary: Positive for vaginal discharge. Negative for dysuria, flank pain, genital sores and pelvic pain.  Musculoskeletal: Negative for back pain and neck pain.  Skin: Negative for rash.  Neurological: Negative for headaches.  Hematological: Does not  bruise/bleed easily.  Psychiatric/Behavioral: Negative for confusion.      Allergies  Sulfa antibiotics  Home Medications   Prior to Admission medications   Medication Sig Start Date End Date Taking? Authorizing Provider  Multiple Vitamin (MULTIVITAMIN WITH MINERALS) TABS tablet Take 1 tablet by mouth daily.    Historical Provider, MD  traMADol (ULTRAM) 50 MG tablet Take 1 tablet (50 mg total) by mouth every 6 (six) hours as needed. 02/27/14   Garlon Hatchet, PA-C   BP 108/59  Pulse 99  Temp(Src) 98.6 F (37 C) (Oral)  Resp 18  SpO2 96% Physical Exam  Nursing note and vitals reviewed. Constitutional: She appears well-developed and well-nourished. No distress.  HENT:  Nose: Nose normal.  Eyes: Conjunctivae are normal. No scleral icterus.  Neck: Neck supple. No tracheal deviation present.  Cardiovascular: Normal rate.   Pulmonary/Chest: Effort normal. No respiratory distress.  Abdominal: Soft. Normal appearance and bowel sounds are normal. She exhibits no distension and no mass. There is no tenderness. There is no rebound and no guarding.  Genitourinary:  No cva tenderness. Normal ext gen.  On vaginal exam, moderate yellowish discharge. No cmt. No adx masses/tenderness felt.   Musculoskeletal: She exhibits no edema.  Neurological: She is alert.  Skin: Skin is warm and dry. No rash noted. She is not diaphoretic.  Psychiatric: She has a normal mood and affect.    ED Course  Procedures (including critical care time) Labs Review  Results for orders placed during the hospital encounter of 08/31/14  WET  PREP, GENITAL      Result Value Ref Range   Yeast Wet Prep HPF POC NONE SEEN  NONE SEEN   Trich, Wet Prep FEW (*) NONE SEEN   Clue Cells Wet Prep HPF POC NONE SEEN  NONE SEEN   WBC, Wet Prep HPF POC TOO NUMEROUS TO COUNT (*) NONE SEEN      MDM  Labs.  Reviewed nursing notes and prior charts for additional history.   rocephin and zithromax given.  Wet prep w trich.  Will tx flagyl.  abd soft nt.   Pt states she does have ob/gyn doctor with whom she can follow up re post men bleeding.  Pt appears stable for d/c.      Suzi Roots, MD 08/31/14 1047

## 2014-08-31 NOTE — ED Notes (Signed)
Pt c/o yellow colored vaginal discharge x 1 month

## 2014-08-31 NOTE — Discharge Instructions (Signed)
It was a pleasure taking care of you today.  We hope you feel better, and that your symptoms resolve with the treatment provided.   You were given treatment for your vaginal discharge. One of the medications we gave will cause side effects if you drink any alcohol - do not consume alcohol for the next 24 hours. For post-menopausal irregular vaginal bleeding, you must follow up with ob/gyn doctor in the next 1-2 weeks - call office Monday to arrange that follow up. Return to ER if worse, new symptoms, severe pain, fevers, other concern.    Vaginitis Vaginitis is an inflammation of the vagina. It is most often caused by a change in the normal balance of the bacteria and yeast that live in the vagina. This change in balance causes an overgrowth of certain bacteria or yeast, which causes the inflammation. There are different types of vaginitis, but the most common types are:  Bacterial vaginosis.  Yeast infection (candidiasis).  Trichomoniasis vaginitis. This is a sexually transmitted infection (STI).  Viral vaginitis.  Atropic vaginitis.  Allergic vaginitis. CAUSES  The cause depends on the type of vaginitis. Vaginitis can be caused by:  Bacteria (bacterial vaginosis).  Yeast (yeast infection).  A parasite (trichomoniasis vaginitis)  A virus (viral vaginitis).  Low hormone levels (atrophic vaginitis). Low hormone levels can occur during pregnancy, breastfeeding, or after menopause.  Irritants, such as bubble baths, scented tampons, and feminine sprays (allergic vaginitis). Other factors can change the normal balance of the yeast and bacteria that live in the vagina. These include:  Antibiotic medicines.  Poor hygiene.  Diaphragms, vaginal sponges, spermicides, birth control pills, and intrauterine devices (IUD).  Sexual intercourse.  Infection.  Uncontrolled diabetes.  A weakened immune system. SYMPTOMS  Symptoms can vary depending on the cause of the vaginitis.  Common symptoms include:  Abnormal vaginal discharge.  The discharge is white, gray, or yellow with bacterial vaginosis.  The discharge is thick, white, and cheesy with a yeast infection.  The discharge is frothy and yellow or greenish with trichomoniasis.  A bad vaginal odor.  The odor is fishy with bacterial vaginosis.  Vaginal itching, pain, or swelling.  Painful intercourse.  Pain or burning when urinating. Sometimes, there are no symptoms. TREATMENT  Treatment will vary depending on the type of infection.   Bacterial vaginosis and trichomoniasis are often treated with antibiotic creams or pills.  Yeast infections are often treated with antifungal medicines, such as vaginal creams or suppositories.  Viral vaginitis has no cure, but symptoms can be treated with medicines that relieve discomfort. Your sexual partner should be treated as well.  Atrophic vaginitis may be treated with an estrogen cream, pill, suppository, or vaginal ring. If vaginal dryness occurs, lubricants and moisturizing creams may help. You may be told to avoid scented soaps, sprays, or douches.  Allergic vaginitis treatment involves quitting the use of the product that is causing the problem. Vaginal creams can be used to treat the symptoms. HOME CARE INSTRUCTIONS   Take all medicines as directed by your caregiver.  Keep your genital area clean and dry. Avoid soap and only rinse the area with water.  Avoid douching. It can remove the healthy bacteria in the vagina.  Do not use tampons or have sexual intercourse until your vaginitis has been treated. Use sanitary pads while you have vaginitis.  Wipe from front to back. This avoids the spread of bacteria from the rectum to the vagina.  Let air reach your genital area.  Wear  cotton underwear to decrease moisture buildup.  Avoid wearing underwear while you sleep until your vaginitis is gone.  Avoid tight pants and underwear or nylons without a  cotton panel.  Take off wet clothing (especially bathing suits) as soon as possible.  Use mild, non-scented products. Avoid using irritants, such as:  Scented feminine sprays.  Fabric softeners.  Scented detergents.  Scented tampons.  Scented soaps or bubble baths.  Practice safe sex and use condoms. Condoms may prevent the spread of trichomoniasis and viral vaginitis. SEEK MEDICAL CARE IF:   You have abdominal pain.  You have a fever or persistent symptoms for more than 2-3 days.  You have a fever and your symptoms suddenly get worse. Document Released: 09/25/2007 Document Revised: 08/22/2012 Document Reviewed: 05/10/2012 [facility] Patient Information 2015 [place], [place]. This information is not intended to replace advice given to you by your health care provider. Make sure you discuss any questions you have with your health care provider.    Cervicitis Cervicitis is a soreness and puffiness (inflammation) of the cervix.  HOME CARE  Do not have sex (intercourse) until your doctor says it is okay.  Do not have sex until your partner is treated or as told by your doctor.  Take your antibiotic medicine as told. Finish it even if you start to feel better. GET HELP IF:   Your symptoms that brought you to the doctor come back.  You have a fever. MAKE SURE YOU:   Understand these instructions.  Will watch your condition.  Will get help right away if you are not doing well or get worse. Document Released: 09/06/2008 Document Revised: 12/03/2013 Document Reviewed: 05/22/2013 [facility] Patient Information 2015 [place], [place]. This information is not intended to replace advice given to you by your health care provider. Make sure you discuss any questions you have with your health care provider.

## 2014-09-02 LAB — GC/CHLAMYDIA PROBE AMP
CT PROBE, AMP APTIMA: NEGATIVE
GC PROBE AMP APTIMA: NEGATIVE

## 2014-09-11 ENCOUNTER — Telehealth (HOSPITAL_BASED_OUTPATIENT_CLINIC_OR_DEPARTMENT_OTHER): Payer: Self-pay | Admitting: Emergency Medicine

## 2015-12-25 ENCOUNTER — Emergency Department (HOSPITAL_COMMUNITY)
Admission: EM | Admit: 2015-12-25 | Discharge: 2015-12-25 | Disposition: A | Discharge status: Home / Self Care | Payer: Self-pay | Attending: Emergency Medicine | Admitting: Emergency Medicine

## 2015-12-25 ENCOUNTER — Encounter (HOSPITAL_COMMUNITY): Payer: Self-pay | Admitting: Emergency Medicine

## 2015-12-25 ENCOUNTER — Emergency Department (HOSPITAL_COMMUNITY): Payer: Self-pay

## 2015-12-25 DIAGNOSIS — Z8742 Personal history of other diseases of the female genital tract: Secondary | ICD-10-CM | POA: Insufficient documentation

## 2015-12-25 DIAGNOSIS — R51 Headache: Secondary | ICD-10-CM | POA: Insufficient documentation

## 2015-12-25 DIAGNOSIS — R079 Chest pain, unspecified: Secondary | ICD-10-CM | POA: Insufficient documentation

## 2015-12-25 DIAGNOSIS — J45909 Unspecified asthma, uncomplicated: Secondary | ICD-10-CM | POA: Insufficient documentation

## 2015-12-25 DIAGNOSIS — Z79899 Other long term (current) drug therapy: Secondary | ICD-10-CM | POA: Insufficient documentation

## 2015-12-25 DIAGNOSIS — R911 Solitary pulmonary nodule: Secondary | ICD-10-CM | POA: Insufficient documentation

## 2015-12-25 DIAGNOSIS — E669 Obesity, unspecified: Secondary | ICD-10-CM | POA: Insufficient documentation

## 2015-12-25 HISTORY — DX: Unspecified asthma, uncomplicated: J45.909

## 2015-12-25 LAB — D-DIMER, QUANTITATIVE (NOT AT ARMC): D DIMER QUANT: 0.48 ug{FEU}/mL (ref 0.00–0.50)

## 2015-12-25 LAB — I-STAT TROPONIN, ED
TROPONIN I, POC: 0 ng/mL (ref 0.00–0.08)
Troponin i, poc: 0 ng/mL (ref 0.00–0.08)

## 2015-12-25 LAB — BASIC METABOLIC PANEL
ANION GAP: 7 (ref 5–15)
BUN: 7 mg/dL (ref 6–20)
CALCIUM: 9.2 mg/dL (ref 8.9–10.3)
CO2: 29 mmol/L (ref 22–32)
CREATININE: 0.93 mg/dL (ref 0.44–1.00)
Chloride: 104 mmol/L (ref 101–111)
GFR calc Af Amer: >60 mL/min (ref >60)
GFR calc non Af Amer: >60 mL/min (ref >60)
GLUCOSE: 109 mg/dL — AB (ref 65–99)
Potassium: 3.9 mmol/L (ref 3.5–5.1)
Sodium: 140 mmol/L (ref 135–145)

## 2015-12-25 LAB — CBC
HCT: 42.3 % (ref 36.0–46.0)
HEMOGLOBIN: 14 g/dL (ref 12.0–15.0)
MCH: 26.5 pg (ref 26.0–34.0)
MCHC: 33.1 g/dL (ref 30.0–36.0)
MCV: 80 fL (ref 78.0–100.0)
Platelets: 341 10*3/uL (ref 150–400)
RBC: 5.29 MIL/uL — ABNORMAL HIGH (ref 3.87–5.11)
RDW: 15 % (ref 11.5–15.5)
WBC: 9.5 10*3/uL (ref 4.0–10.5)

## 2015-12-25 MED ORDER — KETOROLAC TROMETHAMINE 30 MG/ML IJ SOLN
30.0000 mg | Freq: Once | INTRAMUSCULAR | Status: AC
  Administered 2015-12-25: 30 mg via INTRAVENOUS
  Filled 2015-12-25: qty 1

## 2015-12-25 MED ORDER — AZITHROMYCIN 250 MG PO TABS: 250.0000 mg | ORAL_TABLET | Freq: Every day | ORAL | Status: DC

## 2015-12-25 NOTE — ED Notes (Signed)
Patient with chest pain and headache.  She states that her chest hurts when she breaths.  She denies any shortness of breath.  She denies any nausea or vomiting.

## 2015-12-25 NOTE — Discharge Instructions (Signed)
We did not find serious cause of your chest pain today. Your Chest X-ray does show possible lung nodule, which we discussed you will need to follow-up with your primary care doctor about this for repeat imaging in the future to make sure it does not change. Your x-ray also shows questionable pneumonia. Please have a trial of antibiotics and follow-up closely with your primary care provider. Please return without fail for worsening symptoms, including difficulty breathing, worsening pain, or any other symptoms concerning to you.   Nonspecific Chest Pain  Chest pain can be caused by many different conditions. There is always a chance that your pain could be related to something serious, such as a heart attack or a blood clot in your lungs. Chest pain can also be caused by conditions that are not life-threatening. If you have chest pain, it is very important to follow up with your health care provider. CAUSES  Chest pain can be caused by:  Heartburn.  Pneumonia or bronchitis.  Anxiety or stress.  Inflammation around your heart (pericarditis) or lung (pleuritis or pleurisy).  A blood clot in your lung.  A collapsed lung (pneumothorax). It can develop suddenly on its own (spontaneous pneumothorax) or from trauma to the chest.  Shingles infection (varicella-zoster virus).  Heart attack.  Damage to the bones, muscles, and cartilage that make up your chest wall. This can include:  Bruised bones due to injury.  Strained muscles or cartilage due to frequent or repeated coughing or overwork.  Fracture to one or more ribs.  Sore cartilage due to inflammation (costochondritis). RISK FACTORS  Risk factors for chest pain may include:  Activities that increase your risk for trauma or injury to your chest.  Respiratory infections or conditions that cause frequent coughing.  Medical conditions or overeating that can cause heartburn.  Heart disease or family history of heart  disease.  Conditions or health behaviors that increase your risk of developing a blood clot.  Having had chicken pox (varicella zoster). SIGNS AND SYMPTOMS Chest pain can feel like:  Burning or tingling on the surface of your chest or deep in your chest.  Crushing, pressure, aching, or squeezing pain.  Dull or sharp pain that is worse when you move, cough, or take a deep breath.  Pain that is also felt in your back, neck, shoulder, or arm, or pain that spreads to any of these areas. Your chest pain may come and go, or it may stay constant. DIAGNOSIS Lab tests or other studies may be needed to find the cause of your pain. Your health care provider may have you take a test called an ambulatory ECG (electrocardiogram). An ECG records your heartbeat patterns at the time the test is performed. You may also have other tests, such as:  Transthoracic echocardiogram (TTE). During echocardiography, sound waves are used to create a picture of all of the heart structures and to look at how blood flows through your heart.  Transesophageal echocardiogram (TEE).This is a more advanced imaging test that obtains images from inside your body. It allows your health care provider to see your heart in finer detail.  Cardiac monitoring. This allows your health care provider to monitor your heart rate and rhythm in real time.  Holter monitor. This is a portable device that records your heartbeat and can help to diagnose abnormal heartbeats. It allows your health care provider to track your heart activity for several days, if needed.  Stress tests. These can be done through exercise or by  taking medicine that makes your heart beat more quickly.  Blood tests.  Imaging tests. TREATMENT  Your treatment depends on what is causing your chest pain. Treatment may include:  Medicines. These may include:  Acid blockers for heartburn.  Anti-inflammatory medicine.  Pain medicine for inflammatory  conditions.  Antibiotic medicine, if an infection is present.  Medicines to dissolve blood clots.  Medicines to treat coronary artery disease.  Supportive care for conditions that do not require medicines. This may include:  Resting.  Applying heat or cold packs to injured areas.  Limiting activities until pain decreases. HOME CARE INSTRUCTIONS  If you were prescribed an antibiotic medicine, finish it all even if you start to feel better.  Avoid any activities that bring on chest pain.  Do not use any tobacco products, including cigarettes, chewing tobacco, or electronic cigarettes. If you need help quitting, ask your health care provider.  Do not drink alcohol.  Take medicines only as directed by your health care provider.  Keep all follow-up visits as directed by your health care provider. This is important. This includes any further testing if your chest pain does not go away.  If heartburn is the cause for your chest pain, you may be told to keep your head raised (elevated) while sleeping. This reduces the chance that acid will go from your stomach into your esophagus.  Make lifestyle changes as directed by your health care provider. These may include:  Getting regular exercise. Ask your health care provider to suggest some activities that are safe for you.  Eating a heart-healthy diet. A registered dietitian can help you to learn healthy eating options.  Maintaining a healthy weight.  Managing diabetes, if necessary.  Reducing stress. SEEK MEDICAL CARE IF:  Your chest pain does not go away after treatment.  You have a rash with blisters on your chest.  You have a fever. SEEK IMMEDIATE MEDICAL CARE IF:   Your chest pain is worse.  You have an increasing cough, or you cough up blood.  You have severe abdominal pain.  You have severe weakness.  You faint.  You have chills.  You have sudden, unexplained chest discomfort.  You have sudden, unexplained  discomfort in your arms, back, neck, or jaw.  You have shortness of breath at any time.  You suddenly start to sweat, or your skin gets clammy.  You feel nauseous or you vomit.  You suddenly feel light-headed or dizzy.  Your heart begins to beat quickly, or it feels like it is skipping beats. These symptoms may represent a serious problem that is an emergency. Do not wait to see if the symptoms will go away. Get medical help right away. Call your local emergency services (911 in the [place] Do not drive yourself to the hospital.   This information is not intended to replace advice given to you by your health care provider. Make sure you discuss any questions you have with your health care provider.   Document Released: 09/07/2005 Document Revised: 12/19/2014 Document Reviewed: 07/04/2014 Elsevier Interactive Patient Education 2016 [employer]  Nonspecific Chest Pain  Chest pain can be caused by many different conditions. There is always a chance that your pain could be related to something serious, such as a heart attack or a blood clot in your lungs. Chest pain can also be caused by conditions that are not life-threatening. If you have chest pain, it is very important to follow up with your health care provider. CAUSES  Chest  pain can be caused by:  Heartburn.  Pneumonia or bronchitis.  Anxiety or stress.  Inflammation around your heart (pericarditis) or lung (pleuritis or pleurisy).  A blood clot in your lung.  A collapsed lung (pneumothorax). It can develop suddenly on its own (spontaneous pneumothorax) or from trauma to the chest.  Shingles infection (varicella-zoster virus).  Heart attack.  Damage to the bones, muscles, and cartilage that make up your chest wall. This can include:  Bruised bones due to injury.  Strained muscles or cartilage due to frequent or repeated coughing or overwork.  Fracture to one or more ribs.  Sore cartilage due to inflammation  (costochondritis). RISK FACTORS  Risk factors for chest pain may include:  Activities that increase your risk for trauma or injury to your chest.  Respiratory infections or conditions that cause frequent coughing.  Medical conditions or overeating that can cause heartburn.  Heart disease or family history of heart disease.  Conditions or health behaviors that increase your risk of developing a blood clot.  Having had chicken pox (varicella zoster). SIGNS AND SYMPTOMS Chest pain can feel like:  Burning or tingling on the surface of your chest or deep in your chest.  Crushing, pressure, aching, or squeezing pain.  Dull or sharp pain that is worse when you move, cough, or take a deep breath.  Pain that is also felt in your back, neck, shoulder, or arm, or pain that spreads to any of these areas. Your chest pain may come and go, or it may stay constant. DIAGNOSIS Lab tests or other studies may be needed to find the cause of your pain. Your health care provider may have you take a test called an ambulatory ECG (electrocardiogram). An ECG records your heartbeat patterns at the time the test is performed. You may also have other tests, such as:  Transthoracic echocardiogram (TTE). During echocardiography, sound waves are used to create a picture of all of the heart structures and to look at how blood flows through your heart.  Transesophageal echocardiogram (TEE).This is a more advanced imaging test that obtains images from inside your body. It allows your health care provider to see your heart in finer detail.  Cardiac monitoring. This allows your health care provider to monitor your heart rate and rhythm in real time.  Holter monitor. This is a portable device that records your heartbeat and can help to diagnose abnormal heartbeats. It allows your health care provider to track your heart activity for several days, if needed.  Stress tests. These can be done through exercise or by  taking medicine that makes your heart beat more quickly.  Blood tests.  Imaging tests. TREATMENT  Your treatment depends on what is causing your chest pain. Treatment may include:  Medicines. These may include:  Acid blockers for heartburn.  Anti-inflammatory medicine.  Pain medicine for inflammatory conditions.  Antibiotic medicine, if an infection is present.  Medicines to dissolve blood clots.  Medicines to treat coronary artery disease.  Supportive care for conditions that do not require medicines. This may include:  Resting.  Applying heat or cold packs to injured areas.  Limiting activities until pain decreases. HOME CARE INSTRUCTIONS  If you were prescribed an antibiotic medicine, finish it all even if you start to feel better.  Avoid any activities that bring on chest pain.  Do not use any tobacco products, including cigarettes, chewing tobacco, or electronic cigarettes. If you need help quitting, ask your health care provider.  Do not drink  alcohol.  Take medicines only as directed by your health care provider.  Keep all follow-up visits as directed by your health care provider. This is important. This includes any further testing if your chest pain does not go away.  If heartburn is the cause for your chest pain, you may be told to keep your head raised (elevated) while sleeping. This reduces the chance that acid will go from your stomach into your esophagus.  Make lifestyle changes as directed by your health care provider. These may include:  Getting regular exercise. Ask your health care provider to suggest some activities that are safe for you.  Eating a heart-healthy diet. A registered dietitian can help you to learn healthy eating options.  Maintaining a healthy weight.  Managing diabetes, if necessary.  Reducing stress. SEEK MEDICAL CARE IF:  Your chest pain does not go away after treatment.  You have a rash with blisters on your  chest.  You have a fever. SEEK IMMEDIATE MEDICAL CARE IF:   Your chest pain is worse.  You have an increasing cough, or you cough up blood.  You have severe abdominal pain.  You have severe weakness.  You faint.  You have chills.  You have sudden, unexplained chest discomfort.  You have sudden, unexplained discomfort in your arms, back, neck, or jaw.  You have shortness of breath at any time.  You suddenly start to sweat, or your skin gets clammy.  You feel nauseous or you vomit.  You suddenly feel light-headed or dizzy.  Your heart begins to beat quickly, or it feels like it is skipping beats. These symptoms may represent a serious problem that is an emergency. Do not wait to see if the symptoms will go away. Get medical help right away. Call your local emergency services (911 in the [place] Do not drive yourself to the hospital.   This information is not intended to replace advice given to you by your health care provider. Make sure you discuss any questions you have with your health care provider.   Document Released: 09/07/2005 Document Revised: 12/19/2014 Document Reviewed: 07/04/2014 Elsevier Interactive Patient Education [employer]

## 2015-12-25 NOTE — ED Provider Notes (Signed)
CSN: [id number]     Arrival date & time 12/25/15  0434 History   First MD Initiated Contact with Patient 12/25/15 0703     Chief Complaint  Patient presents with  . Chest Pain  . Headache     (Consider location/radiation/quality/duration/timing/severity/associated sxs/prior Treatment) HPI 60 year old female who presents with chest pain and headache. History of obesity and family history of CAD. States that yesterday evening at around 6 PM she developed chest pressure over the lower anterior chest, radiated upwards. Noticed that pain was worse with lying backwards, and worse whenever she took a deep breath in. Denies any associating nausea, vomiting, diaphoresis, dyspnea, abdominal pain, diarrhea. States that recently she was sick over the weekend with flulike illness. States that her symptoms are slowly improving, but has felt tired during the recovery of her illness. Denies any recent cough, congestion, sore throat, runny nose, fever or chills. During the course of the night she began to notice a mild headache at the top of her head associated with some sensitivity to light. States that headache is gradually improving, and now she currently does not have this. Denies any associating vision or speech changes, numbness or weakness, nausea or vomiting. No leg swelling. No leg pain. No history of family history of PE/DVT. Less mobile during illness this weekend  Past Medical History  Diagnosis Date  . Obesity   . Trichomonal vaginitis   . Asthma    Past Surgical History  Procedure Laterality Date  . Laparoscopic gastric banding      2009   History reviewed. No pertinent family history. Social History  Substance Use Topics  . Smoking status: Never Smoker   . Smokeless tobacco: None  . Alcohol Use: Yes     Comment: occ   OB History    No data available     Review of Systems 10/14 systems reviewed and are negative other than those stated in the HPI'   Allergies  Sulfa  antibiotics  Home Medications   Prior to Admission medications   Medication Sig Start Date End Date Taking? Authorizing Provider  azithromycin (ZITHROMAX) 250 MG tablet Take 1 tablet (250 mg total) by mouth daily. Take first 2 tablets together, then 1 every day until finished. 12/25/15   Lavera Guise, MD  Multiple Vitamin (MULTIVITAMIN WITH MINERALS) TABS tablet Take 1 tablet by mouth daily.    Historical Provider, MD  traMADol (ULTRAM) 50 MG tablet Take 1 tablet (50 mg total) by mouth every 6 (six) hours as needed. 02/27/14   Garlon Hatchet, PA-C   BP 116/74 mmHg  Pulse 83  Temp(Src) 98.6 F (37 C) (Oral)  Resp 23  Ht 5\' 5"  (1.651 m)  Wt 280 lb (127.007 kg)  BMI 46.59 kg/m2  SpO2 98%  LMP 02/09/2014 Physical Exam Physical Exam  Nursing note and vitals reviewed. Constitutional: Well developed, well nourished, non-toxic, and in no acute distress Head: Normocephalic and atraumatic.  Mouth/Throat: Oropharynx is clear and moist.  Neck: Normal range of motion. Neck supple.  Cardiovascular: Normal rate and regular rhythm.   Pulmonary/Chest: Effort normal and breath sounds normal.  Abdominal: Soft. There is no tenderness. There is no rebound and no guarding.  Musculoskeletal: Normal range of motion.  Neurological: Alert, no facial droop, fluent speech, moves all extremities symmetrically, PERRL, EOMI, sensation to light touch in tact Skin: Skin is warm and dry.  Psychiatric: Cooperative  ED Course  Procedures (including critical care time) Labs Review Labs Reviewed  BASIC METABOLIC PANEL - Abnormal; Notable for the following:    Glucose, Bld 109 (*)    All other components within normal limits  CBC - Abnormal; Notable for the following:    RBC 5.29 (*)    All other components within normal limits  D-DIMER, QUANTITATIVE (NOT AT [facility])  Rosezena SensorI-STAT TROPOININ, ED  Rosezena SensorI-STAT TROPOININ, ED    Imaging Review Dg Chest 2 View  12/25/2015  CLINICAL DATA:  Chest pain. EXAM: CHEST  2 VIEW  COMPARISON:  No prior. FINDINGS: Mediastinum and hilar structures normal. Question pulmonary nodule left upper lobe. Repeat PA and lateral chest x-ray with apical lordotic views suggested for further evaluation. Mild left base subsegmental atelectasis and or infiltrate. Small left pleural effusion cannot be excluded. Heart size normal. IMPRESSION: 1. Mild left base subsegmental atelectasis and or infiltrate. Pneumonia cannot be excluded. 2. Questionable left upper lobe pulmonary nodule. Follow-up PA and lateral chest x-ray following trial of antibiotics suggested to demonstrate resolution of the left lower lobe subsegmental atelectasis and or infiltrate. An apical lordotic chest x-ray should also be obtained at this time to further evaluate the questionable left upper lobe pulmonary nodule. If the left upper lobe pulmonary nodule remains nonenhanced chest CT can be obtained for further evaluation . Electronically Signed   By: Maisie Fushomas  Register   On: 12/25/2015 07:26   I have personally reviewed and evaluated these images and lab results as part of my medical decision-making.   EKG Interpretation   Date/Time:  Friday December 25 2015 04:34:23 EST Ventricular Rate:  91 PR Interval:  154 QRS Duration: 78 QT Interval:  348 QTC Calculation: 428 R Axis:   72 Text Interpretation:  Normal sinus rhythm Nonspecific T wave abnormality  Abnormal ECG No prior EKG for comparison Confirmed by Maxey Ransom MD, Aowyn Rozeboom ([id number])  on 12/25/2015 7:04:24 AM      MDM   Final diagnoses:  Lung nodule  Chest pain, unspecified chest pain type   60 year old female with history of obesity who presents with chest pain. On arrival, chest pain is resolving. Hemodynamically stable, with a heart rate in the 90s. Cardiopulmonary exam is unremarkable, and remainder of exam is nonfocal. EKG shows no evidence of acute ischemia and no evidence of pericarditis. Serial 3 hour troponins are negative. ACS unlikely, and she has an overall heart  score of 2-3. Chest x-ray shows possible lung nodule and also possible infiltrate versus atelectasis in the left lower lobe. Possible pneumonia especially given her flulike illness over the weekend, and she will be treated for community-acquired pneumonia. Pleuritic nature of pain likely from pleuritis versus pleurisy from her recent illness. D-dimer is negative, and she is low risk for PE. She is adequately ruled out for this. Discussed lung nodule findings with her and she will follow-up with her PCP for further imaging in the future. Strict return and follow-up instructions are reviewed. She expressed understanding of all discharge instructions and felt comfortable to plan of care.   Lavera Guiseana Duo Annalicia Renfrew, MD 12/25/15 [phone]

## 2017-06-15 ENCOUNTER — Ambulatory Visit (HOSPITAL_COMMUNITY)
Admission: EM | Admit: 2017-06-15 | Discharge: 2017-06-15 | Disposition: A | Discharge status: Home / Self Care | Payer: Self-pay | Attending: Family Medicine | Admitting: Family Medicine

## 2017-06-15 ENCOUNTER — Encounter (HOSPITAL_COMMUNITY): Payer: Self-pay | Admitting: Family Medicine

## 2017-06-15 DIAGNOSIS — R600 Localized edema: Secondary | ICD-10-CM

## 2017-06-15 MED ORDER — FUROSEMIDE 40 MG PO TABS: 40.0000 mg | ORAL_TABLET | Freq: Every day | ORAL | 0 refills | Status: DC

## 2017-06-15 NOTE — ED Triage Notes (Signed)
Pt here for bilateral leg pain and swelling x a week. sts worsening in the right leg. sts also lower back pain .

## 2017-06-16 NOTE — ED Provider Notes (Signed)
  [facility]   [id number] 06/15/17 Arrival Time: 1726  ASSESSMENT & PLAN:  Today you were diagnosed with the following: 1. Bilateral edema of lower extremity    You have been prescribed prescription medications this visit  Please take and complete all medications as prescribed.  If you are not improving over the next few days or feel you are worsening please follow up here or the Emergency Department if you are unable to see your regular doctor.  Meds ordered this encounter  Medications  . furosemide (LASIX) 40 MG tablet    Sig: Take 1 tablet (40 mg total) by mouth daily.    Dispense:  10 tablet    Refill:  0   Plans f/u with PCP for workup. Reviewed expectations re: course of current medical issues. Questions answered. Outlined signs and symptoms indicating need for more acute intervention. Patient verbalized understanding. After Visit Summary given.   SUBJECTIVE:  Monica Duarte is a 61 y.o. female who presents with complaint of sporadic bilateral leg swelling for one week. On/off. Traveling more for work. Eating out more. No h/o similar. No new medications. No SOB or CP. Symptoms better in the morning but worsen throughout day. Painful when swollen. No abdominal symptoms. Weight stable.  ROS: As per HPI.  OBJECTIVE:  Vitals:   06/15/17 1826  BP: 134/69  Pulse: 79  Resp: 18  Temp: 98.7 F (37.1 C)  SpO2: 98%    General appearance: alert; no distress Head: normocephalic; atraumatic Lungs: clear to auscultation bilaterally Heart: regular rate and rhythm Abdomen: obese; , non-tender; bowel sounds normal; no masses or organomegaly; no guarding or rebound tenderness Extremities: 1+ pitting edema bilatSkin: warm and dry; no rashes or lesions   Allergies  Allergen Reactions  . Sulfa Antibiotics Other (See Comments)    Gives pt mouth sores    PMHx, SurgHx, SocialHx, Medications, and Allergies were reviewed in the Visit Navigator and updated as  appropriate.      Mardella LaymanHagler, Katerina Zurn, MD 06/16/17 1739

## 2017-08-18 ENCOUNTER — Other Ambulatory Visit (HOSPITAL_COMMUNITY): Payer: Self-pay | Admitting: Nurse Practitioner

## 2017-08-18 ENCOUNTER — Ambulatory Visit (HOSPITAL_COMMUNITY)
Admission: RE | Admit: 2017-08-18 | Discharge: 2017-08-18 | Disposition: A | Discharge status: Home / Self Care | Payer: Self-pay | Source: Ambulatory Visit | Attending: Nurse Practitioner | Admitting: Nurse Practitioner

## 2017-08-18 DIAGNOSIS — M25551 Pain in right hip: Secondary | ICD-10-CM

## 2017-08-18 DIAGNOSIS — M25561 Pain in right knee: Secondary | ICD-10-CM | POA: Insufficient documentation

## 2017-08-18 DIAGNOSIS — M25562 Pain in left knee: Secondary | ICD-10-CM | POA: Insufficient documentation

## 2017-08-18 DIAGNOSIS — M25552 Pain in left hip: Principal | ICD-10-CM

## 2017-08-18 DIAGNOSIS — M170 Bilateral primary osteoarthritis of knee: Secondary | ICD-10-CM | POA: Insufficient documentation

## 2018-09-19 ENCOUNTER — Other Ambulatory Visit: Payer: Self-pay

## 2018-09-20 ENCOUNTER — Ambulatory Visit (INDEPENDENT_AMBULATORY_CARE_PROVIDER_SITE_OTHER): Payer: Self-pay | Admitting: Family Medicine

## 2018-09-20 ENCOUNTER — Encounter: Payer: Self-pay | Admitting: Family Medicine

## 2018-09-20 VITALS — BP 120/72 | HR 80 | Temp 97.3°F | Resp 17 | Ht 66.0 in | Wt 288.4 lb

## 2018-09-20 DIAGNOSIS — Z13220 Encounter for screening for lipoid disorders: Secondary | ICD-10-CM

## 2018-09-20 DIAGNOSIS — F439 Reaction to severe stress, unspecified: Secondary | ICD-10-CM

## 2018-09-20 DIAGNOSIS — M1990 Unspecified osteoarthritis, unspecified site: Secondary | ICD-10-CM

## 2018-09-20 DIAGNOSIS — R911 Solitary pulmonary nodule: Secondary | ICD-10-CM

## 2018-09-20 DIAGNOSIS — F419 Anxiety disorder, unspecified: Secondary | ICD-10-CM

## 2018-09-20 DIAGNOSIS — L309 Dermatitis, unspecified: Secondary | ICD-10-CM

## 2018-09-20 DIAGNOSIS — Z131 Encounter for screening for diabetes mellitus: Secondary | ICD-10-CM

## 2018-09-20 DIAGNOSIS — Z130 Encounter for screening for diseases of the blood and blood-forming organs and certain disorders involving the immune mechanism: Secondary | ICD-10-CM

## 2018-09-20 MED ORDER — MELOXICAM 7.5 MG PO TABS: 7.5000 mg | ORAL_TABLET | Freq: Two times a day (BID) | ORAL | 2 refills | Status: DC

## 2018-09-20 MED ORDER — TRIAMCINOLONE ACETONIDE 0.1 % EX CREA: 1.0000 "application " | TOPICAL_CREAM | Freq: Two times a day (BID) | CUTANEOUS | 1 refills | Status: DC

## 2018-09-20 NOTE — Progress Notes (Signed)
Monica Duarte, is a 62 y.o. female  BJY:[id number]  YQM:[id number]  DOB - November 17, 1956  CC:  Chief Complaint  Patient presents with  . Establish Care  . Arthritis    needs Rx meds. was previously on Rx but ran out. has been using OTC arthritis meds with no relief.  . Eczema    needs Rx cream. has been using OTC creams with no relief       HPI: Monica Duarte is a 62 y.o. female is here today to establish care. Recently lost employment and health insurance.  Chronic health problems include:has Anxiety; Stress at home; and Eczema on their problem list.   Concerns related to today's visit:  Questionable left upper lobe pulmonary nodule noted on chest x-ray from 2017. No repeat imaging performed. She has never smoked and has no family history of lung disease.  Arthritis in knees and hips, previously prescribed Meloxicam. Unable refill due to no PCP. Reports prior diagnosis via imaging. Taking over the counter acetaminophen  and ibuprofen with mild relief. She also suffers from degenerative back disease. Complains of occasional edema of ankles. No prior diuretic therapy prescribed. Elevating feet typically resolves problem. Eczema recently flared. She has been trying to keep skin moist with over lotions and creams, however skin is still itching. She is currently under stress and reports anxiety secondary to the loss of her job and caring for an elderly parent. Reports family talks bad about her as her mother's social security check is her only means of support right now. She is presently seeking employment and considering placing her mother in adult daycare and or permanent placement within a facility. Patient denies headaches, chest pain, abdominal pain, nausea, new weakness , numbness or tingling, SOB, edema, or worrisome cough.  Current medications: Current Outpatient Medications:  Marland Kitchen  Multiple Vitamin (MULTIVITAMIN WITH MINERALS) TABS tablet, Take 1 tablet by mouth daily., Disp: , Rfl:    Pertinent  family medical history: Dementia-Mother   Allergies  Allergen Reactions  . Sulfa Antibiotics Other (See Comments)    Gives pt mouth sores    Social History   Socioeconomic History  . Marital status: Divorced    Spouse name: Not on file  . Number of children: Not on file  . Years of education: Not on file  . Highest education level: Not on file  Occupational History  . Not on file  Social Needs  . Financial resource strain: Not on file  . Food insecurity:    Worry: Not on file    Inability: Not on file  . Transportation needs:    Medical: Not on file    Non-medical: Not on file  Tobacco Use  . Smoking status: Never Smoker  Substance and Sexual Activity  . Alcohol use: Yes    Comment: occ  . Drug use: No  . Sexual activity: Not on file  Lifestyle  . Physical activity:    Days per week: Not on file    Minutes per session: Not on file  . Stress: Not on file  Relationships  . Social connections:    Talks on phone: Not on file    Gets together: Not on file    Attends religious service: Not on file    Active member of club or organization: Not on file    Attends meetings of clubs or organizations: Not on file    Relationship status: Not on file  . Intimate partner violence:    Fear of current or ex partner:  Not on file    Emotionally abused: Not on file    Physically abused: Not on file    Forced sexual activity: Not on file  Other Topics Concern  . Not on file  Social History Narrative  . Not on file   Review of Systems  Constitutional: Negative.   HENT: Negative.   Respiratory: Negative.   Cardiovascular: Negative.   Musculoskeletal: Positive for joint pain and myalgias.  Skin: Positive for rash.  Neurological: Negative.   Psychiatric/Behavioral: The patient is nervous/anxious.    Objective:   Vitals:   09/20/18 1101  BP: 120/72  Pulse: 80  Resp: 17  Temp: (!) 97.3 F (36.3 C)  SpO2: 95%    BP Readings from Last 3 Encounters:  06/15/17 134/69   12/25/15 116/74  08/31/14 108/59    Filed Weights   09/20/18 1101  Weight: 288 lb 6.4 oz (130.8 kg)     Physical Exam  Constitutional: She is oriented to person, place, and time. She appears well-developed and well-nourished.  Neck: Normal range of motion.  Cardiovascular: Normal rate, regular rhythm, normal heart sounds and intact distal pulses.  Pulmonary/Chest: Effort normal and breath sounds normal.  Abdominal: Soft. Bowel sounds are normal. She exhibits no distension. There is no tenderness.  Musculoskeletal: She exhibits edema.  Trace edema bilateral ankles  Lymphadenopathy:    She has no cervical adenopathy.  Neurological: She is alert and oriented to person, place, and time.  Skin: Skin is warm and dry.  Scaly, dry, areas of skin.       Lab Results  Component Value Date   WBC 9.5 12/25/2015   HGB 14.0 12/25/2015   HCT 42.3 12/25/2015   MCV 80.0 12/25/2015   PLT 341 12/25/2015   Lab Results  Component Value Date   CREATININE 0.93 12/25/2015   BUN 7 12/25/2015   NA 140 12/25/2015   K 3.9 12/25/2015   CL 104 12/25/2015   CO2 29 12/25/2015       Assessment and plan:  1. Screening for diabetes mellitus - Hemoglobin A1c; Future - Comprehensive metabolic panel; Future  2. Stress at home 3. Anxiety -Related to financial worries and she [place] caregiver for elderly parent. -Currently actively looking for employment -Considering adult daycare vs permanent placement for mother   - Thyroid Panel With TSH; Future -Offered our counseling services if needed. Patient will follow-up if she decides to speak with someone.  4. Screening, lipid - Lipid panel; Future  5. Eczema, unspecified type We will trial triamcinolone cream 2-3 times daily as needed to effected areas. Patient reports she normally treats eczema exacerbations with topical cream however was uncertain of the name.  6. Screening for deficiency anemia - CBC with Differential; Future  7. Left upper lobe  pulmonary nodule -Patient to return to office in 1 week for repeat chest x-ray.  X-ray tech is not in the office today.    Meds ordered this encounter  Medications  . triamcinolone cream (KENALOG) 0.1 %    Sig: Apply 1 application topically 2 (two) times daily.    Dispense:  454 g    Refill:  1  . meloxicam (MOBIC) 7.5 MG tablet    Sig: Take 1 tablet (7.5 mg total) by mouth 2 (two) times daily.    Dispense:  60 tablet    Refill:  2   Return in about 9 months (around 06/21/2019) for Complete Physical Exam.  The patient was given clear instructions to go to  ER or return to medical center if symptoms don't improve, worsen or new problems develop. The patient verbalized understanding. The patient was told to call to get lab results if they haven't heard anything in the next week.    Joaquin Courts, FNP Primary Care at [facility] [address] [place] [address] [phone]: [phone]    This note has been created with Dragon speech recognition software and Paediatric nurse. Any transcriptional errors are unintentional.

## 2018-09-20 NOTE — Patient Instructions (Signed)
Review medication list attached and take all medications as prescribed today.  Have labs drawn and pick-up medications at: [facility]  [address] [place], [place] [address] [phone]   Thank you for choosing Primary Care at [facility] for your medical home!    Monica Duarte was seen by Joaquin Courts, FNP today.   Monica Duarte's primary care doctor is Bing Neighbors, FNP.   For the best care possible,  you should try to see Joaquin Courts, FNP  whenever you come to clinic.   We look forward to seeing you again soon!  If you have any questions about your visit today,  please call us at   Or feel free to reach your provider via MyChart.

## 2018-09-21 ENCOUNTER — Ambulatory Visit: Payer: Medicaid Other | Attending: Family Medicine

## 2018-09-21 DIAGNOSIS — Z130 Encounter for screening for diseases of the blood and blood-forming organs and certain disorders involving the immune mechanism: Secondary | ICD-10-CM | POA: Diagnosis not present

## 2018-09-21 DIAGNOSIS — F439 Reaction to severe stress, unspecified: Secondary | ICD-10-CM

## 2018-09-21 DIAGNOSIS — Z131 Encounter for screening for diabetes mellitus: Secondary | ICD-10-CM | POA: Diagnosis not present

## 2018-09-21 DIAGNOSIS — F419 Anxiety disorder, unspecified: Secondary | ICD-10-CM | POA: Insufficient documentation

## 2018-09-21 DIAGNOSIS — L309 Dermatitis, unspecified: Secondary | ICD-10-CM | POA: Insufficient documentation

## 2018-09-21 DIAGNOSIS — Z13220 Encounter for screening for lipoid disorders: Secondary | ICD-10-CM | POA: Diagnosis not present

## 2018-09-21 DIAGNOSIS — M1990 Unspecified osteoarthritis, unspecified site: Secondary | ICD-10-CM | POA: Insufficient documentation

## 2018-09-21 MED FILL — MELOXICAM 7.5 MG TABLET: 7.5 | 30 days supply | Qty: 60 | Fill #0

## 2018-09-21 MED FILL — TRIAMCINOLONE 0.1% CREAM: 0.1 | 30 days supply | Qty: 454 | Fill #0

## 2018-09-22 LAB — CBC WITH DIFFERENTIAL/PLATELET
BASOS ABS: 0 10*3/uL (ref 0.0–0.2)
Basos: 1 %
EOS (ABSOLUTE): 0.2 10*3/uL (ref 0.0–0.4)
Eos: 3 %
Hematocrit: 41.8 % (ref 34.0–46.6)
Hemoglobin: 14 g/dL (ref 11.1–15.9)
Immature Grans (Abs): 0 10*3/uL (ref 0.0–0.1)
Immature Granulocytes: 0 %
LYMPHS ABS: 1.7 10*3/uL (ref 0.7–3.1)
LYMPHS: 29 %
MCH: 26.7 pg (ref 26.6–33.0)
MCHC: 33.5 g/dL (ref 31.5–35.7)
MCV: 80 fL (ref 79–97)
Monocytes Absolute: 0.4 10*3/uL (ref 0.1–0.9)
Monocytes: 6 %
NEUTROS ABS: 3.7 10*3/uL (ref 1.4–7.0)
Neutrophils: 61 %
PLATELETS: 356 10*3/uL (ref 150–450)
RBC: 5.24 x10E6/uL (ref 3.77–5.28)
RDW: 15.3 % (ref 12.3–15.4)
WBC: 5.9 10*3/uL (ref 3.4–10.8)

## 2018-09-22 LAB — LIPID PANEL
CHOL/HDL RATIO: 2.8 ratio (ref 0.0–4.4)
Cholesterol, Total: 150 mg/dL (ref 100–199)
HDL: 54 mg/dL (ref >39)
LDL Calculated: 81 mg/dL (ref 0–99)
TRIGLYCERIDES: 77 mg/dL (ref 0–149)
VLDL CHOLESTEROL CAL: 15 mg/dL (ref 5–40)

## 2018-09-22 LAB — COMPREHENSIVE METABOLIC PANEL
A/G RATIO: 1.4 (ref 1.2–2.2)
ALK PHOS: 79 IU/L (ref 39–117)
ALT: 13 IU/L (ref 0–32)
AST: 11 IU/L (ref 0–40)
Albumin: 4 g/dL (ref 3.5–5.5)
BILIRUBIN TOTAL: 0.2 mg/dL (ref 0.0–1.2)
BUN / CREAT RATIO: 14 (ref 9–23)
BUN: 11 mg/dL (ref 6–24)
CHLORIDE: 103 mmol/L (ref 96–106)
CO2: 25 mmol/L (ref 20–29)
Calcium: 9.4 mg/dL (ref 8.7–10.2)
Creatinine, Ser: 0.8 mg/dL (ref 0.57–1.00)
GFR calc non Af Amer: 81 mL/min/{1.73_m2} (ref >59)
GFR, EST AFRICAN AMERICAN: 93 mL/min/{1.73_m2} (ref >59)
Globulin, Total: 2.8 g/dL (ref 1.5–4.5)
Glucose: 141 mg/dL — ABNORMAL HIGH (ref 65–99)
POTASSIUM: 4.2 mmol/L (ref 3.5–5.2)
Sodium: 143 mmol/L (ref 134–144)
TOTAL PROTEIN: 6.8 g/dL (ref 6.0–8.5)

## 2018-09-22 LAB — THYROID PANEL WITH TSH
Free Thyroxine Index: 2 (ref 1.2–4.9)
T3 UPTAKE RATIO: 27 % (ref 24–39)
T4, Total: 7.5 ug/dL (ref 4.5–12.0)
TSH: 1.05 u[IU]/mL (ref 0.450–4.500)

## 2018-09-22 LAB — HEMOGLOBIN A1C
Est. average glucose Bld gHb Est-mCnc: 128 mg/dL
HEMOGLOBIN A1C: 6.1 % — AB (ref 4.8–5.6)

## 2018-09-25 NOTE — Progress Notes (Signed)
Patient notified of results. Expressed understanding. Will call back to make lab appt.

## 2018-09-27 ENCOUNTER — Ambulatory Visit: Payer: Self-pay | Admitting: Family Medicine

## 2018-10-25 MED FILL — OXYBUTYNIN 5 MG TABLET: 5 | 30 days supply | Qty: 60 | Fill #0

## 2018-10-25 MED FILL — SERTRALINE HCL 50 MG TABS: 50 | 30 days supply | Qty: 30 | Fill #0

## 2018-11-06 MED FILL — NAPROXEN 500 MG TABLET: 500 | 30 days supply | Qty: 60 | Fill #0

## 2018-12-03 MED FILL — NAPROXEN 500 MG TABLET: 500 | 30 days supply | Qty: 60 | Fill #0

## 2018-12-21 ENCOUNTER — Encounter: Payer: Self-pay | Admitting: Emergency Medicine

## 2018-12-21 ENCOUNTER — Ambulatory Visit
Admission: EM | Admit: 2018-12-21 | Discharge: 2018-12-21 | Disposition: A | Discharge status: Home / Self Care | Payer: Self-pay | Attending: Emergency Medicine | Admitting: Emergency Medicine

## 2018-12-21 DIAGNOSIS — B351 Tinea unguium: Secondary | ICD-10-CM | POA: Insufficient documentation

## 2018-12-21 DIAGNOSIS — M79674 Pain in right toe(s): Secondary | ICD-10-CM | POA: Insufficient documentation

## 2018-12-21 DIAGNOSIS — K219 Gastro-esophageal reflux disease without esophagitis: Secondary | ICD-10-CM | POA: Insufficient documentation

## 2018-12-21 MED ORDER — OMEPRAZOLE 20 MG PO CPDR: 20.0000 mg | DELAYED_RELEASE_CAPSULE | Freq: Every day | ORAL | 0 refills | Status: DC

## 2018-12-21 MED ORDER — TRAMADOL HCL 50 MG PO TABS: 50.0000 mg | ORAL_TABLET | Freq: Four times a day (QID) | ORAL | 0 refills | Status: DC | PRN

## 2018-12-21 MED ORDER — FLUCONAZOLE 200 MG PO TABS: 200.0000 mg | ORAL_TABLET | ORAL | 0 refills | Status: AC

## 2018-12-21 MED FILL — FLUCONAZOLE 200 MG TABLET: 200 | 28 days supply | Qty: 4 | Fill #0

## 2018-12-21 MED FILL — OMEPRAZOLE 20 MG CAP: 20 | 14 days supply | Qty: 14 | Fill #0

## 2018-12-21 NOTE — ED Notes (Signed)
Patient able to ambulate independently  

## 2018-12-21 NOTE — ED Notes (Signed)
Pt added, prior to this RN leaving room, that she would like to discuss her reflux as well.

## 2018-12-21 NOTE — ED Provider Notes (Signed)
[facility]    CSN: [id number] Arrival date & time: 12/21/18  1110     History   Chief Complaint Chief Complaint  Patient presents with  . Toe Pain    HPI Monica Duarte is a 62 y.o. female history of arthritis, eczema presenting today for evaluation of right great toe pain.  Patient has been concerned that she has an infection in this toenail.  She states that she has had progressive thickening and discoloration of this toenail over the past year.  Recently it is started to cause her pain.  She has tried using over-the-counter topical antifungal regimens without relief.  The pain has begun to affect her sleep.  She denies any injury or difficulty moving toe.  She denies any numbness or tingling.  Patient is also concerned about reflux.  States that she has had worsening indigestion of recently especially with certain foods including fried food/fast food.  She will have occasional vomiting related to this.  Denies chest pain or shortness of breath.  Denies diarrhea or abdominal pain.  She does not take anything for reflux.  HPI  Past Medical History:  Diagnosis Date  . Asthma   . Obesity     Patient Active Problem List   Diagnosis Date Noted  . Anxiety 09/21/2018  . Stress at home 09/21/2018  . Eczema 09/21/2018  . Arthritis 09/21/2018    Past Surgical History:  Procedure Laterality Date  . LAPAROSCOPIC GASTRIC BANDING     2009    OB History   No obstetric history on file.      Home Medications    Prior to Admission medications   Medication Sig Start Date End Date Taking? Authorizing Provider  oxybutynin (DITROPAN) 5 MG tablet Take 5 mg by mouth 3 (three) times daily.   Yes [provider]  triamcinolone cream (KENALOG) 0.1 % Apply 1 application topically 2 (two) times daily. 09/20/18  Yes Bing Neighbors, FNP  acetaminophen (TYLENOL 8 HOUR ARTHRITIS PAIN) 650 MG CR tablet Take 650 mg by mouth every 8 (eight) hours as needed for  pain.    [provider]  fluconazole (DIFLUCAN) 200 MG tablet Take 1 tablet (200 mg total) by mouth once a week for 24 doses. 12/21/18 06/01/19  Wieters, Hallie C, PA-C  meloxicam (MOBIC) 7.5 MG tablet Take 1 tablet (7.5 mg total) by mouth 2 (two) times daily. 09/20/18   Bing Neighbors, FNP  Multiple Vitamin (MULTIVITAMIN WITH MINERALS) TABS tablet Take 1 tablet by mouth daily.    [provider]  omeprazole (PRILOSEC) 20 MG capsule Take 1 capsule (20 mg total) by mouth daily for 14 days. 12/21/18 01/04/19  Wieters, Hallie C, PA-C  traMADol (ULTRAM) 50 MG tablet Take 1 tablet (50 mg total) by mouth every 6 (six) hours as needed for severe pain. 12/21/18   Wieters, Junius Creamer, PA-C    Family History History reviewed. No pertinent family history.  Social History Social History   Tobacco Use  . Smoking status: Never Smoker  . Smokeless tobacco: Never Used  Substance Use Topics  . Alcohol use: Yes    Comment: occ  . Drug use: No     Allergies   Sulfa antibiotics   Review of Systems Review of Systems  Constitutional: Negative for fatigue and fever.  HENT: Negative for mouth sores.   Eyes: Negative for visual disturbance.  Respiratory: Negative for shortness of breath.   Cardiovascular: Negative for chest pain.  Gastrointestinal: Negative  for abdominal pain, nausea and vomiting.  Genitourinary: Negative for genital sores.  Musculoskeletal: Negative for arthralgias and joint swelling.  Skin: Positive for color change. Negative for rash and wound.  Neurological: Negative for dizziness, weakness, light-headedness and headaches.     Physical Exam Triage Vital Signs ED Triage Vitals [12/21/18 1121]  Enc Vitals Group     BP (!) 143/82     Pulse Rate 71     Resp 18     Temp 97.7 F (36.5 C)     Temp Source Oral     SpO2 95 %     Weight      Height      Head Circumference      Peak Flow      Pain Score 0     Pain Loc      Pain Edu?      Excl. in GC?     No data found.  Updated Vital Signs BP (!) 143/82 (BP Location: Left Arm)   Pulse 71   Temp 97.7 F (36.5 C) (Oral)   Resp 18   LMP 02/09/2014   SpO2 95%   Visual Acuity Right Eye Distance:   Left Eye Distance:   Bilateral Distance:    Right Eye Near:   Left Eye Near:    Bilateral Near:     Physical Exam Vitals signs and nursing note reviewed.  Constitutional:      General: She is not in acute distress.    Appearance: She is well-developed.  HENT:     Head: Normocephalic and atraumatic.  Eyes:     Conjunctiva/sclera: Conjunctivae normal.  Neck:     Musculoskeletal: Neck supple.  Cardiovascular:     Rate and Rhythm: Normal rate and regular rhythm.     Heart sounds: No murmur.  Pulmonary:     Effort: Pulmonary effort is normal. No respiratory distress.     Breath sounds: Normal breath sounds.     Comments: Breathing comfortably at rest, CTABL, no wheezing, rales or other adventitious sounds auscultated Abdominal:     Palpations: Abdomen is soft.     Tenderness: There is no abdominal tenderness.     Comments: Nontender to light deep palpation throughout abdomen  Musculoskeletal:     Comments: Dorsalis pedis 2+  Skin:    General: Skin is warm and dry.     Comments: Right great toe appears thickened, yellow discoloration distally, unable to see nail due to nail polish  No surrounding erythema or tenderness to the nail folds  Neurological:     Mental Status: She is alert.      UC Treatments / Results  Labs (all labs ordered are listed, but only abnormal results are displayed) Labs Reviewed - No data to display  EKG None  Radiology No results found.  Procedures Procedures (including critical care time)  Medications Ordered in UC Medications - No data to display  Initial Impression / Assessment and Plan / UC Course  I have reviewed the triage vital signs and the nursing notes.  Pertinent labs & imaging results that were available during my care  of the patient were reviewed by me and considered in my medical decision making (see chart for details).     Patient appears to likely have onychomycosis of right great toe.  Failed topical over-the-counter treatments.  Will provide Diflucan 200 mg weekly for the next 3 to 6 months.  Discussed also trying soaks.  Does not have any paronychia at  this time, does not warrant antibiotics currently, will continue to monitor.  Did provide tramadol for patient to use only for severe pain/nighttime.  Use sparingly.  Otherwise use anti-inflammatories for pain.  Will initiate omeprazole daily for the next 2 weeks and monitor reflux symptoms.  Discussed following up with gastroenterology if symptoms persistent.  Return here if having chest pain or shortness of breath or symptoms worsening with exertion.  Do not suspect underlying cardiac etiology at this time.  Patient is currently asymptomatic.  Discussed strict return precautions. Patient verbalized understanding and is agreeable with plan.  Final Clinical Impressions(s) / UC Diagnoses   Final diagnoses:  Pain due to onychomycosis of toenail of right foot  Gastroesophageal reflux disease, esophagitis presence not specified     Discharge Instructions     Please begin taking Diflucan weekly for the next 3 to 6 months We are looking for your nail to be growing out normally from the bottom Please keep nail trimmed short You may try soaks of Listerine and vinegar; apply Vicks VapoRub at bedtime You may use tramadol for severe pain  You may try omeprazole 20 mg daily for the next 2 weeks to see if this helps decrease your reflux   ED Prescriptions    Medication Sig Dispense Auth. Provider   fluconazole (DIFLUCAN) 200 MG tablet Take 1 tablet (200 mg total) by mouth once a week for 24 doses. 24 tablet Wieters, Hallie C, PA-C   traMADol (ULTRAM) 50 MG tablet Take 1 tablet (50 mg total) by mouth every 6 (six) hours as needed for severe pain. 15 tablet  Wieters, Hallie C, PA-C   omeprazole (PRILOSEC) 20 MG capsule Take 1 capsule (20 mg total) by mouth daily for 14 days. 14 capsule Wieters, Hallie C, PA-C     Controlled Substance Prescriptions [place] Controlled Substance Registry consulted? Not Applicable   Lew DawesWieters, Hallie C, [place]PA-C 12/21/18 1323

## 2018-12-21 NOTE — Discharge Instructions (Signed)
Please begin taking Diflucan weekly for the next 3 to 6 months We are looking for your nail to be growing out normally from the bottom Please keep nail trimmed short You may try soaks of Listerine and vinegar; apply Vicks VapoRub at bedtime You may use tramadol for severe pain  You may try omeprazole 20 mg daily for the next 2 weeks to see if this helps decrease your reflux

## 2018-12-21 NOTE — ED Triage Notes (Signed)
Pt presents to [facility] for assessment of great toe pain on the right toe.

## 2018-12-23 ENCOUNTER — Emergency Department (HOSPITAL_COMMUNITY): Payer: Medicaid Other

## 2018-12-23 ENCOUNTER — Other Ambulatory Visit: Payer: Self-pay

## 2018-12-23 ENCOUNTER — Encounter (HOSPITAL_COMMUNITY): Payer: Self-pay

## 2018-12-23 ENCOUNTER — Emergency Department (HOSPITAL_COMMUNITY)
Admission: EM | Admit: 2018-12-23 | Discharge: 2018-12-23 | Disposition: A | Discharge status: Home / Self Care | Payer: Medicaid Other | Attending: Emergency Medicine | Admitting: Emergency Medicine

## 2018-12-23 DIAGNOSIS — F419 Anxiety disorder, unspecified: Secondary | ICD-10-CM | POA: Diagnosis not present

## 2018-12-23 DIAGNOSIS — Z9884 Bariatric surgery status: Secondary | ICD-10-CM | POA: Insufficient documentation

## 2018-12-23 DIAGNOSIS — Z79899 Other long term (current) drug therapy: Secondary | ICD-10-CM | POA: Diagnosis not present

## 2018-12-23 DIAGNOSIS — J45909 Unspecified asthma, uncomplicated: Secondary | ICD-10-CM | POA: Diagnosis not present

## 2018-12-23 DIAGNOSIS — M25551 Pain in right hip: Secondary | ICD-10-CM | POA: Diagnosis not present

## 2018-12-23 MED ORDER — MELOXICAM 15 MG PO TABS: 15.0000 mg | ORAL_TABLET | Freq: Every day | ORAL | 0 refills | Status: DC

## 2018-12-23 MED ORDER — MELOXICAM 15 MG PO TABS
15.0000 mg | ORAL_TABLET | Freq: Once | ORAL | Status: AC
  Administered 2018-12-23: 15 mg via ORAL
  Filled 2018-12-23: qty 1

## 2018-12-23 NOTE — ED Triage Notes (Addendum)
Pt states that she has pain in her right hip "traveling" down her leg. Pt states pain shoots down when laying. Pt reports difficulty putting on socks.  Pt denies trauma or injury. Pt ambulatory in triage.

## 2018-12-23 NOTE — Discharge Instructions (Signed)
Use mobic daily as directed, don't use additional NSAIDs like ibuprofen/aleve/etc while taking mobic. Use additional tylenol as needed for additional pain relief. Use warm compresses to the area of soreness, no more than 20 minutes per hour. Follow up with your regular doctor in 1 week for recheck of symptoms.

## 2018-12-23 NOTE — ED Provider Notes (Signed)
[facility]-EMERGENCY DEPT Provider Note   CSN: [id number] Arrival date & time: 12/23/18  1534     History   Chief Complaint Chief Complaint  Patient presents with  . Hip Pain    HPI Monica Duarte is a 63 y.o. female with a PMHx of asthma, obesity, and eczema, who presents to the ED with complaints of right hip pain that is worse since yesterday.  She states that she normally has hip pain if she walks for too long, but yesterday it started getting worse.  She describes the pain as 5/10 intermittent dull aching right hip pain that radiates down her leg, worse with activity or walking, and improves with laying on her stomach, but has been unrelieved with naproxen.  She denies any injury or trauma, but she states that she does a lot of heavy lifting at work.  She denies any new or worsening back pain, swelling to the leg, bruising or erythema, warmth, fevers, chills, numbness, tingling, focal weakness, or any other complaints at this time.  The history is provided by the patient and medical records. No language interpreter was used.  Hip Pain     Past Medical History:  Diagnosis Date  . Asthma   . Obesity     Patient Active Problem List   Diagnosis Date Noted  . Anxiety 09/21/2018  . Stress at home 09/21/2018  . Eczema 09/21/2018  . Arthritis 09/21/2018    Past Surgical History:  Procedure Laterality Date  . LAPAROSCOPIC GASTRIC BANDING     2009     OB History   No obstetric history on file.      Home Medications    Prior to Admission medications   Medication Sig Start Date End Date Taking? Authorizing Provider  acetaminophen (TYLENOL 8 HOUR ARTHRITIS PAIN) 650 MG CR tablet Take 650 mg by mouth every 8 (eight) hours as needed for pain.    [provider]  fluconazole (DIFLUCAN) 200 MG tablet Take 1 tablet (200 mg total) by mouth once a week for 24 doses. 12/21/18 06/01/19  Wieters, Hallie C, PA-C  meloxicam (MOBIC) 7.5 MG tablet  Take 1 tablet (7.5 mg total) by mouth 2 (two) times daily. 09/20/18   Bing NeighborsHarris, Kimberly S, FNP  Multiple Vitamin (MULTIVITAMIN WITH MINERALS) TABS tablet Take 1 tablet by mouth daily.    [provider]  omeprazole (PRILOSEC) 20 MG capsule Take 1 capsule (20 mg total) by mouth daily for 14 days. 12/21/18 01/04/19  Wieters, Hallie C, PA-C  oxybutynin (DITROPAN) 5 MG tablet Take 5 mg by mouth 3 (three) times daily.    [provider]  traMADol (ULTRAM) 50 MG tablet Take 1 tablet (50 mg total) by mouth every 6 (six) hours as needed for severe pain. 12/21/18   Wieters, Hallie C, PA-C  triamcinolone cream (KENALOG) 0.1 % Apply 1 application topically 2 (two) times daily. 09/20/18   Bing NeighborsHarris, Kimberly S, FNP    Family History No family history on file.  Social History Social History   Tobacco Use  . Smoking status: Never Smoker  . Smokeless tobacco: Never Used  Substance Use Topics  . Alcohol use: Yes    Comment: occ  . Drug use: No     Allergies   Sulfa antibiotics   Review of Systems Review of Systems  Constitutional: Negative for chills and fever.  Musculoskeletal: Positive for arthralgias. Negative for back pain.  Skin: Negative for color change.  Allergic/Immunologic: Negative for immunocompromised state.  Neurological: Negative for weakness and numbness.  Psychiatric/Behavioral: Negative for confusion.   Physical Exam Updated Vital Signs BP 128/72 (BP Location: Left Arm)   Pulse 78   Temp 98.4 F (36.9 C) (Oral)   Resp 16   Ht 5\' 6"  (1.676 m)   Wt 127 kg   LMP 02/09/2014   SpO2 98%   BMI 45.19 kg/m   Physical Exam Vitals signs and nursing note reviewed.  Constitutional:      General: She is not in acute distress.    Appearance: Normal appearance. She is well-developed. She is not toxic-appearing.     Comments: Afebrile, nontoxic, NAD  HENT:     Head: Normocephalic and atraumatic.  Eyes:     General:        Right eye: No discharge.        Left  eye: No discharge.     Conjunctiva/sclera: Conjunctivae normal.  Neck:     Musculoskeletal: Normal range of motion and neck supple.  Cardiovascular:     Rate and Rhythm: Normal rate.     Pulses: Normal pulses.  Pulmonary:     Effort: Pulmonary effort is normal. No respiratory distress.  Abdominal:     General: There is no distension.  Musculoskeletal: Normal range of motion.     Right hip: She exhibits tenderness. She exhibits normal range of motion, no swelling, no crepitus and no deformity.     Comments: R hip with FROM intact, with mild lateral joint line TTP, no bruising or swelling, no crepitus or deformity, no limb length discrepancy or abnormal rotation. +pain with log roll testing and FABER testing. Strength and sensation grossly intact, distal pulses intact, compartments soft. No spinal tenderness or stepoffs/deformities. Gait steady  Skin:    General: Skin is warm and dry.     Findings: No rash.  Neurological:     Mental Status: She is alert and oriented to person, place, and time.     Sensory: Sensation is intact. No sensory deficit.     Motor: Motor function is intact.  Psychiatric:        Mood and Affect: Mood and affect normal.        Behavior: Behavior normal.      ED Treatments / Results  Labs (all labs ordered are listed, but only abnormal results are displayed) Labs Reviewed - No data to display  EKG None  Radiology Dg Hip Unilat W Or Wo Pelvis 2-3 Views Right  Result Date: 12/23/2018 CLINICAL DATA:  Right-sided hip pain. EXAM: DG HIP (WITH OR WITHOUT PELVIS) 2-3V RIGHT COMPARISON:  None. FINDINGS: There is no evidence of hip fracture or dislocation. There is no evidence of arthropathy or other focal bone abnormality. IMPRESSION: Negative. Electronically Signed   By: Kennith Center M.D.   On: 12/23/2018 17:41    Procedures Procedures (including critical care time)  Medications Ordered in ED Medications  meloxicam (MOBIC) tablet 15 mg (15 mg Oral Given  12/23/18 1659)     Initial Impression / Assessment and Plan / ED Course  I have reviewed the triage vital signs and the nursing notes.  Pertinent labs & imaging results that were available during my care of the patient were reviewed by me and considered in my medical decision making (see chart for details).     63 y.o. female here with R hip pain worse today. On exam, mild TTP to lateral joint line, no spinal tenderness, extremities NVI, gait steady. Will get xray to eval  for arthritic findings, will give mobic and reassess shortly.   6:01 PM Xray neg for acute findings, joint spaces look fairly well preserved. Could be mild arthritis not yet seen on xray. Will start mobic. Advised tylenol/heat use, f/up with PCP in 1wk for recheck. I explained the diagnosis and have given explicit precautions to return to the ER including for any other new or worsening symptoms. The patient understands and accepts the medical plan as it's been dictated and I have answered their questions. Discharge instructions concerning home care and prescriptions have been given. The patient is STABLE and is discharged to home in good condition.    Final Clinical Impressions(s) / ED Diagnoses   Final diagnoses:  Right hip pain    ED Discharge Orders         Ordered    meloxicam (MOBIC) 15 MG tablet  Daily     12/23/18 [address] [place], [place] 12/23/18 1810    Donnetta Hutching, MD 12/23/18 [phone]

## 2018-12-23 NOTE — ED Notes (Signed)
Patient transported to X-ray 

## 2019-01-08 ENCOUNTER — Ambulatory Visit: Payer: Self-pay | Admitting: Family Medicine

## 2019-01-17 ENCOUNTER — Ambulatory Visit (INDEPENDENT_AMBULATORY_CARE_PROVIDER_SITE_OTHER): Payer: Self-pay | Admitting: Family Medicine

## 2019-01-17 ENCOUNTER — Encounter: Payer: Self-pay | Admitting: Family Medicine

## 2019-01-17 VITALS — BP 129/88 | HR 83 | Resp 17 | Ht 66.0 in | Wt 287.8 lb

## 2019-01-17 DIAGNOSIS — H5789 Other specified disorders of eye and adnexa: Secondary | ICD-10-CM

## 2019-01-17 DIAGNOSIS — J309 Allergic rhinitis, unspecified: Secondary | ICD-10-CM

## 2019-01-17 DIAGNOSIS — R059 Cough, unspecified: Secondary | ICD-10-CM

## 2019-01-17 DIAGNOSIS — R05 Cough: Secondary | ICD-10-CM

## 2019-01-17 DIAGNOSIS — L309 Dermatitis, unspecified: Secondary | ICD-10-CM

## 2019-01-17 DIAGNOSIS — B351 Tinea unguium: Secondary | ICD-10-CM

## 2019-01-17 MED ORDER — OMEPRAZOLE 40 MG PO CPDR: 40.0000 mg | DELAYED_RELEASE_CAPSULE | Freq: Every day | ORAL | 3 refills | Status: DC

## 2019-01-17 MED ORDER — CETIRIZINE HCL 10 MG PO TABS: 10.0000 mg | ORAL_TABLET | Freq: Every day | ORAL | 11 refills | Status: DC

## 2019-01-17 MED ORDER — TRIAMCINOLONE ACETONIDE 0.5 % EX OINT: 1.0000 "application " | TOPICAL_OINTMENT | Freq: Two times a day (BID) | CUTANEOUS | 1 refills | Status: DC

## 2019-01-17 MED ORDER — MONTELUKAST SODIUM 10 MG PO TABS: 10.0000 mg | ORAL_TABLET | Freq: Every day | ORAL | 3 refills | Status: DC

## 2019-01-17 MED ORDER — METHYLPREDNISOLONE SODIUM SUCC 125 MG IJ SOLR
62.5000 mg | Freq: Once | INTRAMUSCULAR | Status: AC
  Administered 2019-01-17: 62.5 mg via INTRAMUSCULAR

## 2019-01-17 NOTE — Progress Notes (Signed)
Established Patient Office Visit  Subjective:  Patient ID: Monica Duarte, female    DOB: February 09, 1956  Age: 63 y.o. MRN: [id number]  CC:  Chief Complaint  Patient presents with  . Eczema    HPI Monica Duarte presents for eczema follow-up.  Dermatitis/Eczema: Monica Duarte is here for evaluation of dermatitis. Patient complains of a diffuse skin rash located on the torso, neck and extremities. Patient describes the rash as itchy and dry. The rash has been chronic and on-going for several years. She has in the past found relief with triamcinolone ointment opposed to steroidal creams. She endorses consistent OTC emollient use to maintain hydration of skin. Denies excoriation of skin or development of skin plaques. She endorses chronic rhinitis symptoms. Currently taking Singulair which has provided minimal relief. Endorses a mild cough which is nonproductive. Denies wheezing or shortness of breath.  Patient recently evaluated in urgent care for fungal infection of right great toe. She was prescribed fluconazole x 24 weeks for treatment. She is in need of a referral to podiatry as she feels the toe nail is causing pain and needs to be removed. No other toes are affected.   Past Medical History:  Diagnosis Date  . Asthma   . Obesity     Past Surgical History:  Procedure Laterality Date  . LAPAROSCOPIC GASTRIC BANDING     2009    No family history on file.  Social History   Socioeconomic History  . Marital status: Divorced    Spouse name: Not on file  . Number of children: Not on file  . Years of education: Not on file  . Highest education level: Not on file  Occupational History  . Not on file  Social Needs  . Financial resource strain: Not on file  . Food insecurity:    Worry: Not on file    Inability: Not on file  . Transportation needs:    Medical: Not on file    Non-medical: Not on file  Tobacco Use  . Smoking status: Never Smoker  . Smokeless  tobacco: Never Used  Substance and Sexual Activity  . Alcohol use: Yes    Comment: occ  . Drug use: No  . Sexual activity: Not on file  Lifestyle  . Physical activity:    Days per week: Not on file    Minutes per session: Not on file  . Stress: Not on file  Relationships  . Social connections:    Talks on phone: Not on file    Gets together: Not on file    Attends religious service: Not on file    Active member of club or organization: Not on file    Attends meetings of clubs or organizations: Not on file    Relationship status: Not on file  . Intimate partner violence:    Fear of current or ex partner: Not on file    Emotionally abused: Not on file    Physically abused: Not on file    Forced sexual activity: Not on file  Other Topics Concern  . Not on file  Social History Narrative  . Not on file    Outpatient Medications Prior to Visit  Medication Sig Dispense Refill  . fluconazole (DIFLUCAN) 200 MG tablet Take 1 tablet (200 mg total) by mouth once a week for 24 doses. 24 tablet 0  . meloxicam (MOBIC) 15 MG tablet Take 1 tablet (15 mg total) by mouth daily. TAKE WITH MEALS 30 tablet 0  .  Multiple Vitamin (MULTIVITAMIN WITH MINERALS) TABS tablet Take 1 tablet by mouth daily.    Marland Kitchen. oxybutynin (DITROPAN) 5 MG tablet Take 5 mg by mouth 3 (three) times daily.    . sertraline (ZOLOFT) 50 MG tablet Take 50 mg by mouth daily.    . traMADol (ULTRAM) 50 MG tablet Take 1 tablet (50 mg total) by mouth every 6 (six) hours as needed for severe pain. 15 tablet 0  . triamcinolone cream (KENALOG) 0.1 % Apply 1 application topically 2 (two) times daily. 454 g 1  . acetaminophen (TYLENOL 8 HOUR ARTHRITIS PAIN) 650 MG CR tablet Take 650 mg by mouth every 8 (eight) hours as needed for pain.    Marland Kitchen. omeprazole (PRILOSEC) 20 MG capsule Take 1 capsule (20 mg total) by mouth daily for 14 days. 14 capsule 0   No facility-administered medications prior to visit.     Allergies  Allergen Reactions  .  Sulfa Antibiotics Other (See Comments)    Gives pt mouth sores    ROS Review of Systems Pertinent negatives listed in HPI  Objective:    Physical Exam BP 129/88   Pulse 83   Resp 17   Ht 5\' 6"  (1.676 m)   Wt 287 lb 12.8 oz (130.5 kg)   LMP 02/09/2014   SpO2 96%   BMI 46.45 kg/m    General appearance: alert, well developed, well nourished, cooperative and in no distress Head: Normocephalic, without obvious abnormality, atraumatic Respiratory: Respirations even and unlabored, normal respiratory rate Heart: Regular rate and rhythm. No murmurs or gallops. Extremities: No gross deformities Skin: Positive for rash and dry skin. Color appropriate. Right great toe nail discoloration  Psych: Appropriate mood and affect. Neurologic: Mental status: Alert, oriented to person, place, and time, thought content appropriate.   Wt Readings from Last 3 Encounters:  01/17/19 287 lb 12.8 oz (130.5 kg)  12/23/18 280 lb (127 kg)  09/20/18 288 lb 6.4 oz (130.8 kg)     Health Maintenance Due  Topic Date Due  . Hepatitis C Screening  August 25, 1956  . HIV Screening  11/01/1973  . TETANUS/TDAP  11/01/1977  . MAMMOGRAM  11/01/2008  . COLONOSCOPY  11/01/2008  . PAP SMEAR-Modifier  05/24/2015  . INFLUENZA VACCINE  07/12/2018    There are no preventive care reminders to display for this patient.  Lab Results  Component Value Date   TSH 1.050 09/21/2018   Lab Results  Component Value Date   WBC 5.9 09/21/2018   HGB 14.0 09/21/2018   HCT 41.8 09/21/2018   MCV 80 09/21/2018   PLT 356 09/21/2018   Lab Results  Component Value Date   NA 143 09/21/2018   K 4.2 09/21/2018   CO2 25 09/21/2018   GLUCOSE 141 (H) 09/21/2018   BUN 11 09/21/2018   CREATININE 0.80 09/21/2018   BILITOT 0.2 09/21/2018   ALKPHOS 79 09/21/2018   AST 11 09/21/2018   ALT 13 09/21/2018   PROT 6.8 09/21/2018   ALBUMIN 4.0 09/21/2018   CALCIUM 9.4 09/21/2018   ANIONGAP 7 12/25/2015   Lab Results  Component  Value Date   CHOL 150 09/21/2018   Lab Results  Component Value Date   HDL 54 09/21/2018   Lab Results  Component Value Date   LDLCALC 81 09/21/2018   Lab Results  Component Value Date   TRIG 77 09/21/2018   Lab Results  Component Value Date   CHOLHDL 2.8 09/21/2018   Lab Results  Component Value Date  HGBA1C 6.1 (H) 09/21/2018      Assessment & Plan:  1. Itchy, watery, and red eye 2. Cough 3. Allergic rhinitis, unspecified seasonality, unspecified trigger -Given patients history of prediabetes, would like to defer from prescribing oral prednisone. Will administer a 62.5 mg of solumedrol here in office.  Continue Singulair and add cetirizine 10 mg once daily. Ordered: - methylPREDNISolone sodium succinate (SOLU-MEDROL) 125 mg/2 mL injection 62.5 mg  4. Fungal infection of toenail - Ambulatory referral to Podiatry Continue fluticasone 150 mg once weekly as prescribed.   5. Eczema  Will trial triamcinolone ointment 0.5% , mix with emollient or petroleum jelly to allow for liberal distribution onto skin. Encouraged consistent hydration of skin with emollient creams Hydrate well with water 6-8, 8 ounce glasses per day.  Meds ordered this encounter  Medications  . methylPREDNISolone sodium succinate (SOLU-MEDROL) 125 mg/2 mL injection 62.5 mg  . montelukast (SINGULAIR) 10 MG tablet    Sig: Take 1 tablet (10 mg total) by mouth at bedtime.    Dispense:  30 tablet    Refill:  3  . cetirizine (ZYRTEC) 10 MG tablet    Sig: Take 1 tablet (10 mg total) by mouth daily.    Dispense:  30 tablet    Refill:  11  . triamcinolone ointment (KENALOG) 0.5 %    Sig: Apply 1 application topically 2 (two) times daily.    Dispense:  90 g    Refill:  1  . omeprazole (PRILOSEC) 40 MG capsule    Sig: Take 1 capsule (40 mg total) by mouth daily.    Dispense:  30 capsule    Refill:  3    Follow-up: Return in about 4 weeks (around 02/14/2019) for return in 4-6 wks for physical and  fasting labs . Check liver studies, patient currently prescribed long-term anti-fungal therapy.   Joaquin Courts, FNP-C

## 2019-01-17 NOTE — Progress Notes (Deleted)
Patient ID: Monica Duarte, female    DOB: 07-26-1956, 63 y.o.   MRN: [id number]  PCP: Bing NeighborsHarris, Kimberly S, FNP  Chief Complaint  Patient presents with  . Eczema    Subjective:  HPI  Monica Duarte is a 63 y.o. female presents for evaluation and follow up of eczema. She    Social History   Socioeconomic History  . Marital status: Divorced    Spouse name: Not on file  . Number of children: Not on file  . Years of education: Not on file  . Highest education level: Not on file  Occupational History  . Not on file  Social Needs  . Financial resource strain: Not on file  . Food insecurity:    Worry: Not on file    Inability: Not on file  . Transportation needs:    Medical: Not on file    Non-medical: Not on file  Tobacco Use  . Smoking status: Never Smoker  . Smokeless tobacco: Never Used  Substance and Sexual Activity  . Alcohol use: Yes    Comment: occ  . Drug use: No  . Sexual activity: Not on file  Lifestyle  . Physical activity:    Days per week: Not on file    Minutes per session: Not on file  . Stress: Not on file  Relationships  . Social connections:    Talks on phone: Not on file    Gets together: Not on file    Attends religious service: Not on file    Active member of club or organization: Not on file    Attends meetings of clubs or organizations: Not on file    Relationship status: Not on file  . Intimate partner violence:    Fear of current or ex partner: Not on file    Emotionally abused: Not on file    Physically abused: Not on file    Forced sexual activity: Not on file  Other Topics Concern  . Not on file  Social History Narrative  . Not on file    No family history on file.   Review of Systems  Patient Active Problem List   Diagnosis Date Noted  . Anxiety 09/21/2018  . Stress at home 09/21/2018  . Eczema 09/21/2018  . Arthritis 09/21/2018    Allergies  Allergen Reactions  . Sulfa Antibiotics Other (See Comments)    Gives pt mouth sores    Prior to Admission medications   Medication Sig Start Date End Date Taking? Authorizing Provider  fluconazole (DIFLUCAN) 200 MG tablet Take 1 tablet (200 mg total) by mouth once a week for 24 doses. 12/21/18 06/01/19 Yes Wieters, Hallie C, PA-C  meloxicam (MOBIC) 15 MG tablet Take 1 tablet (15 mg total) by mouth daily. TAKE WITH MEALS 12/23/18  Yes Street, [place], [place]  Multiple Vitamin (MULTIVITAMIN WITH MINERALS) TABS tablet Take 1 tablet by mouth daily.   Yes [provider]  oxybutynin (DITROPAN) 5 MG tablet Take 5 mg by mouth 3 (three) times daily.   Yes [provider]  sertraline (ZOLOFT) 50 MG tablet Take 50 mg by mouth daily.   Yes [provider]  traMADol (ULTRAM) 50 MG tablet Take 1 tablet (50 mg total) by mouth every 6 (six) hours as needed for severe pain. 12/21/18  Yes Wieters, Hallie C, PA-C  cetirizine (ZYRTEC) 10 MG tablet Take 1 tablet (10 mg total) by mouth daily. 01/17/19   Bing NeighborsHarris, Kimberly S, FNP  montelukast (SINGULAIR) 10  MG tablet Take 1 tablet (10 mg total) by mouth at bedtime. 01/17/19   Bing Neighbors, FNP  omeprazole (PRILOSEC) 40 MG capsule Take 1 capsule (40 mg total) by mouth daily. 01/17/19   Bing Neighbors, FNP  triamcinolone ointment (KENALOG) 0.5 % Apply 1 application topically 2 (two) times daily. 01/17/19   Bing Neighbors, FNP    Past Medical, Surgical Family and Social History reviewed and updated.    Objective:   Today's Vitals   01/17/19 1610  BP: 129/88  Pulse: 83  Resp: 17  SpO2: 96%  Weight: 287 lb 12.8 oz (130.5 kg)  Height: 5\' 6"  (1.676 m)    Wt Readings from Last 3 Encounters:  01/17/19 287 lb 12.8 oz (130.5 kg)  12/23/18 280 lb (127 kg)  09/20/18 288 lb 6.4 oz (130.8 kg)     Physical Exam  No results found for: POCGLU  Lab Results  Component Value Date   HGBA1C 6.1 (H) 09/21/2018            Assessment & Plan:  1. Itchy, watery, and red eye ***  2. Cough *** -  methylPREDNISolone sodium succinate (SOLU-MEDROL) 125 mg/2 mL injection 62.5 mg  3. Allergic rhinitis, unspecified seasonality, unspecified trigger *** - methylPREDNISolone sodium succinate (SOLU-MEDROL) 125 mg/2 mL injection 62.5 mg  4. Fungal infection of toenail *** - Ambulatory referral to Podiatry   If symptoms worsen or do not improve, return for follow-up, follow-up with PCP, or at the emergency department if severity of symptoms warrant a higher level of care.     Legrand Pitts, FNP-S Primary Care at [facility] [address] [place]: [phone]

## 2019-01-17 NOTE — Patient Instructions (Addendum)
I am increasing your prilosec to 40mg  daily for GERD I am starting you on singulair an zyrtec for allergies I am referring you to podiatry, they will contact you to schedule an appointment  Please make sure to hydrate and use plenty of emollients such as to keep skin moisturized, also make sure bath water temperatures are not very hot needs to be warm and or cool.  Eczema Eczema is a broad term for a group of skin conditions that cause skin to become rough and inflamed. Each type of eczema has different triggers, symptoms, and treatments. Eczema of any type is usually itchy and symptoms range from mild to severe. Eczema and its symptoms are not spread from person to person (are not contagious). It can appear on different parts of the body at different times. Your eczema may not look the same as someone else's eczema. What are the types of eczema? Atopic dermatitis This is a long-term (chronic) skin disease that keeps coming back (recurring). Usual symptoms are dry skin and small, solid pimples that may swell and leak fluid (weep). Contact dermatitis  This happens when something irritates the skin and causes a rash. The irritation can come from substances that you are allergic to (allergens), such as poison ivy, chemicals, or medicines that were applied to your skin. Dyshidrotic eczema This is a form of eczema on the hands and feet. It shows up as very itchy, fluid-filled blisters. It can affect people of any age, but is more common before age 46. Hand eczema  This causes very itchy areas of skin on the palms and sides of the hands and fingers. This type of eczema is common in industrial jobs where you may be exposed to many different types of irritants. Lichen simplex chronicus This type of eczema occurs when a person constantly scratches one area of the body. Repeated scratching of the area leads to thickened skin (lichenification). Lichen simplex chronicus can occur along with other types of  eczema. It is more common in adults, but may be seen in children as well. Nummular eczema This is a common type of eczema. It has no known cause. It typically causes a red, circular, crusty lesion (plaque) that may be itchy. Scratching may become a habit and can cause bleeding. Nummular eczema occurs most often in people of middle-age or older. It most often affects the hands. Seborrheic dermatitis This is a common skin disease that mainly affects the scalp. It may also affect any oily areas of the body, such as the face, sides of nose, eyebrows, ears, eyelids, and chest. It is marked by small scaling and redness of the skin (erythema). This can affect people of all ages. In infants, this condition is known as Location manager." Stasis dermatitis This is a common skin disease that usually appears on the legs and feet. It most often occurs in people who have a condition that prevents blood from being pumped through the veins in the legs (chronic venous insufficiency). Stasis dermatitis is a chronic condition that needs long-term management. How is eczema diagnosed? Your health care provider will examine your skin and review your medical history. He or she may also give you skin patch tests. These tests involve taking patches that contain possible allergens and placing them on your back. He or she will then check in a few days to see if an allergic reaction occurred. What are the common treatments? Treatment for eczema is based on the type of eczema you have. Hydrocortisone steroid medicine  can relieve itching quickly and help reduce inflammation. This medicine may be prescribed or obtained over-the-counter, depending on the strength of the medicine that is needed. Follow these instructions at home:  Take over-the-counter and prescription medicines only as told by your health care provider.  Use creams or ointments to moisturize your skin. Do not use lotions.  Learn what triggers or irritates your  symptoms. Avoid these things.  Treat symptom flare-ups quickly.  Do not itch your skin. This can make your rash worse.  Keep all follow-up visits as told by your health care provider. This is important. Where to find more information  The American Academy of Dermatology: InfoExam.si  The National Eczema Association: www.nationaleczema.org Contact a health care provider if:  You have serious itching, even with treatment.  You regularly scratch your skin until it bleeds.  Your rash looks different than usual.  Your skin is painful, swollen, or more red than usual.  You have a fever. Summary  There are eight general types of eczema. Each type has different triggers.  Eczema of any type causes itching that may range from mild to severe.  Treatment varies based on the type of eczema you have. Hydrocortisone steroid medicine can help with itching and inflammation.  Protecting your skin is the best way to prevent eczema. Use moisturizers and lotions. Avoid triggers and irritants, and treat flare-ups quickly. This information is not intended to replace advice given to you by your health care provider. Make sure you discuss any questions you have with your health care provider. Document Released: 04/13/2017 Document Revised: 04/13/2017 Document Reviewed: 04/13/2017 Elsevier Interactive Patient Education  2019 Elsevier Inc.  Gastroesophageal Reflux Disease, Adult Gastroesophageal reflux (GER) happens when acid from the stomach flows up into the tube that connects the mouth and the stomach (esophagus). Normally, food travels down the esophagus and stays in the stomach to be digested. With GER, food and stomach acid sometimes move back up into the esophagus. You may have a disease called gastroesophageal reflux disease (GERD) if the reflux:  Happens often.  Causes frequent or very bad symptoms.  Causes problems such as damage to the esophagus. When this happens, the esophagus becomes  sore and swollen (inflamed). Over time, GERD can make small holes (ulcers) in the lining of the esophagus. What are the causes? This condition is caused by a problem with the muscle between the esophagus and the stomach. When this muscle is weak or not normal, it does not close properly to keep food and acid from coming back up from the stomach. The muscle can be weak because of:  Tobacco use.  Pregnancy.  Having a certain type of hernia (hiatal hernia).  Alcohol use.  Certain foods and drinks, such as coffee, chocolate, onions, and peppermint. What increases the risk? You are more likely to develop this condition if you:  Are overweight.  Have a disease that affects your connective tissue.  Use NSAID medicines. What are the signs or symptoms? Symptoms of this condition include:  Heartburn.  Difficult or painful swallowing.  The feeling of having a lump in the throat.  A bitter taste in the mouth.  Bad breath.  Having a lot of saliva.  Having an upset or bloated stomach.  Belching.  Chest pain. Different conditions can cause chest pain. Make sure you see your doctor if you have chest pain.  Shortness of breath or noisy breathing (wheezing).  Ongoing (chronic) cough or a cough at night.  Wearing away of the  surface of teeth (tooth enamel).  Weight loss. How is this treated? Treatment will depend on how bad your symptoms are. Your doctor may suggest:  Changes to your diet.  Medicine.  Surgery. Follow these instructions at home: Eating and drinking   Follow a diet as told by your doctor. You may need to avoid foods and drinks such as: ? Coffee and tea (with or without caffeine). ? Drinks that contain alcohol. ? Energy drinks and sports drinks. ? Bubbly (carbonated) drinks or sodas. ? Chocolate and cocoa. ? Peppermint and mint flavorings. ? Garlic and onions. ? Horseradish. ? Spicy and acidic foods. These include peppers, chili powder, curry powder,  vinegar, hot sauces, and BBQ sauce. ? Citrus fruit juices and citrus fruits, such as oranges, lemons, and limes. ? Tomato-based foods. These include red sauce, chili, salsa, and pizza with red sauce. ? Fried and fatty foods. These include donuts, french fries, potato chips, and high-fat dressings. ? High-fat meats. These include hot dogs, rib eye steak, sausage, ham, and bacon. ? High-fat dairy items, such as whole milk, butter, and cream cheese.  Eat small meals often. Avoid eating large meals.  Avoid drinking large amounts of liquid with your meals.  Avoid eating meals during the 2-3 hours before bedtime.  Avoid lying down right after you eat.  Do not exercise right after you eat. Lifestyle   Do not use any products that contain nicotine or tobacco. These include cigarettes, e-cigarettes, and chewing tobacco. If you need help quitting, ask your doctor.  Try to lower your stress. If you need help doing this, ask your doctor.  If you are overweight, lose an amount of weight that is healthy for you. Ask your doctor about a safe weight loss goal. General instructions  Pay attention to any changes in your symptoms.  Take over-the-counter and prescription medicines only as told by your doctor. Do not take aspirin, ibuprofen, or other NSAIDs unless your doctor says it is okay.  Wear loose clothes. Do not wear anything tight around your waist.  Raise (elevate) the head of your bed about 6 inches (15 cm).  Avoid bending over if this makes your symptoms worse.  Keep all follow-up visits as told by your doctor. This is important. Contact a doctor if:  You have new symptoms.  You lose weight and you do not know why.  You have trouble swallowing or it hurts to swallow.  You have wheezing or a cough that keeps happening.  Your symptoms do not get better with treatment.  You have a hoarse voice. Get help right away if:  You have pain in your arms, neck, jaw, teeth, or  back.  You feel sweaty, dizzy, or light-headed.  You have chest pain or shortness of breath.  You throw up (vomit) and your throw-up looks like blood or coffee grounds.  You pass out (faint).  Your poop (stool) is bloody or black.  You cannot swallow, drink, or eat. Summary  If a person has gastroesophageal reflux disease (GERD), food and stomach acid move back up into the esophagus and cause symptoms or problems such as damage to the esophagus.  Treatment will depend on how bad your symptoms are.  Follow a diet as told by your doctor.  Take all medicines only as told by your doctor. This information is not intended to replace advice given to you by your health care provider. Make sure you discuss any questions you have with your health care provider. Document Released:  05/16/2008 Document Revised: 06/06/2018 Document Reviewed: 06/06/2018 Elsevier Interactive Patient Education  [employer]2019 Elsevier Inc.

## 2019-01-31 MED FILL — MONTELUKAST SOD 10 MG TAB: 10 | 30 days supply | Qty: 30 | Fill #0

## 2019-01-31 MED FILL — TRIAMCINOLONE 0.5% OINTMENT: 0.5 | 15 days supply | Qty: 30 | Fill #0

## 2019-01-31 MED FILL — OMEPRAZOLE DR 40 MG CAPSULE: 40 | 30 days supply | Qty: 30 | Fill #0

## 2019-02-14 ENCOUNTER — Encounter: Payer: Self-pay | Admitting: Family Medicine

## 2019-02-14 ENCOUNTER — Ambulatory Visit: Payer: Medicaid Other | Admitting: Family Medicine

## 2019-02-14 VITALS — BP 124/73 | HR 83 | Temp 98.1°F | Resp 17 | Ht 66.0 in | Wt 290.6 lb

## 2019-02-14 DIAGNOSIS — L600 Ingrowing nail: Secondary | ICD-10-CM

## 2019-02-14 DIAGNOSIS — M25551 Pain in right hip: Secondary | ICD-10-CM | POA: Diagnosis not present

## 2019-02-14 DIAGNOSIS — Z0000 Encounter for general adult medical examination without abnormal findings: Secondary | ICD-10-CM

## 2019-02-14 DIAGNOSIS — Z23 Encounter for immunization: Secondary | ICD-10-CM | POA: Diagnosis not present

## 2019-02-14 DIAGNOSIS — B351 Tinea unguium: Secondary | ICD-10-CM | POA: Diagnosis not present

## 2019-02-14 DIAGNOSIS — R7303 Prediabetes: Secondary | ICD-10-CM

## 2019-02-14 DIAGNOSIS — Z1389 Encounter for screening for other disorder: Secondary | ICD-10-CM

## 2019-02-14 DIAGNOSIS — Z1211 Encounter for screening for malignant neoplasm of colon: Secondary | ICD-10-CM

## 2019-02-14 DIAGNOSIS — Z1231 Encounter for screening mammogram for malignant neoplasm of breast: Secondary | ICD-10-CM

## 2019-02-14 DIAGNOSIS — Z6841 Body Mass Index (BMI) 40.0 and over, adult: Secondary | ICD-10-CM

## 2019-02-14 LAB — POCT URINALYSIS DIP (CLINITEK)
BILIRUBIN UA: NEGATIVE
Glucose, UA: NEGATIVE mg/dL
Ketones, POC UA: NEGATIVE mg/dL
Leukocytes, UA: NEGATIVE
NITRITE UA: NEGATIVE
PH UA: 7 (ref 5.0–8.0)
POC PROTEIN,UA: NEGATIVE
Spec Grav, UA: 1.015 (ref 1.010–1.025)
Urobilinogen, UA: 0.2 E.U./dL

## 2019-02-14 MED ORDER — OXYBUTYNIN CHLORIDE 5 MG PO TABS: 5.0000 mg | ORAL_TABLET | Freq: Three times a day (TID) | ORAL | 3 refills | Status: DC

## 2019-02-14 MED ORDER — SERTRALINE HCL 50 MG PO TABS: 50.0000 mg | ORAL_TABLET | Freq: Every day | ORAL | 2 refills | Status: DC

## 2019-02-14 MED ORDER — CYCLOBENZAPRINE HCL 10 MG PO TABS: 10.0000 mg | ORAL_TABLET | Freq: Three times a day (TID) | ORAL | 0 refills | Status: DC | PRN

## 2019-02-14 MED ORDER — MELOXICAM 15 MG PO TABS: 15.0000 mg | ORAL_TABLET | Freq: Every day | ORAL | 0 refills | Status: DC

## 2019-02-14 MED FILL — SERTRALINE HCL 50 MG TABS: 50 | 30 days supply | Qty: 30 | Fill #0

## 2019-02-14 MED FILL — OXYBUTYNIN 5 MG TABLET: 5 | 30 days supply | Qty: 90 | Fill #0

## 2019-02-14 MED FILL — CYCLOBENZAPRINE 10 MG TAB: 10 | 10 days supply | Qty: 30 | Fill #0

## 2019-02-14 MED FILL — MELOXICAM 15 MG TABLET: 15 | 30 days supply | Qty: 30 | Fill #0

## 2019-02-14 NOTE — Progress Notes (Signed)
Patient ID: Monica Duarte, female    DOB: [birth year], 63 y.o.   MRN: [id number]  PCP: Bing Neighbors, FNP  Chief Complaint  Patient presents with  . Annual Exam    Subjective:  HPI Monica Duarte is a 63 y.o. female, nonsmoker, presents for complete physical exam. Medical problems significant for chronic right hip pain, chronic eczema, prediabetes, overactive bladder, and morbid obesity. She is here for CPE, although screening labs were completed during visit 09/21/2018. Remains inactive of routine physical activity. Current Body mass index is 46.9 kg/m.  Complains of acute problems: Right hip continues to hurt, currently taking Meloxicam. Recent imaging negative of any acute or chronic findings. Secondly complains of right great toe thicken toe nail . She would like toe nail removed as it cause discomfort with walking.  Health Promotion: Health Screening Current/Overdue:   Immunizations-TDAP overdue, stepped on anail a few days prior PAP- overdue. Last 2013 Overdue as of 2018. Need PAP Mammogram-overdue, ordered today Colonscopy or Cologuard Last Dental Exam: 2019 Last Eye Exam: 2018   Chronic conditions include: Patient Active Problem List   Diagnosis Date Noted  . Anxiety 09/21/2018  . Stress at home 09/21/2018  . Eczema 09/21/2018  . Arthritis 09/21/2018    Current home medications include: Prior to Admission medications   Medication Sig Start Date End Date Taking? Authorizing Provider  cetirizine (ZYRTEC) 10 MG tablet Take 1 tablet (10 mg total) by mouth daily. 01/17/19  Yes Bing Neighbors, FNP  fluconazole (DIFLUCAN) 200 MG tablet Take 1 tablet (200 mg total) by mouth once a week for 24 doses. 12/21/18 06/01/19 Yes Wieters, Hallie C, PA-C  meloxicam (MOBIC) 15 MG tablet Take 1 tablet (15 mg total) by mouth daily. TAKE WITH MEALS 02/14/19  Yes Bing Neighbors, FNP  montelukast (SINGULAIR) 10 MG tablet Take 1 tablet (10 mg total) by mouth at bedtime.  01/17/19  Yes Bing Neighbors, FNP  Multiple Vitamin (MULTIVITAMIN WITH MINERALS) TABS tablet Take 1 tablet by mouth daily.   Yes [provider]  omeprazole (PRILOSEC) 40 MG capsule Take 1 capsule (40 mg total) by mouth daily. 01/17/19  Yes Bing Neighbors, FNP  traMADol (ULTRAM) 50 MG tablet Take 1 tablet (50 mg total) by mouth every 6 (six) hours as needed for severe pain. 12/21/18  Yes Wieters, Hallie C, PA-C  triamcinolone ointment (KENALOG) 0.5 % Apply 1 application topically 2 (two) times daily. 01/17/19  Yes Bing Neighbors, FNP  oxybutynin (DITROPAN) 5 MG tablet Take 1 tablet (5 mg total) by mouth 3 (three) times daily. 02/14/19   Bing Neighbors, FNP  sertraline (ZOLOFT) 50 MG tablet Take 1 tablet (50 mg total) by mouth daily. 02/14/19   Bing Neighbors, FNP   No family history on file.   Allergies  Allergen Reactions  . Sulfa Antibiotics Other (See Comments)    Gives pt mouth sores    Social History   Socioeconomic History  . Marital status: Divorced    Spouse name: Not on file  . Number of children: Not on file  . Years of education: Not on file  . Highest education level: Not on file  Occupational History  . Not on file  Social Needs  . Financial resource strain: Not on file  . Food insecurity:    Worry: Not on file    Inability: Not on file  . Transportation needs:    Medical: Not on file    Non-medical: Not on  file  Tobacco Use  . Smoking status: Never Smoker  . Smokeless tobacco: Never Used  Substance and Sexual Activity  . Alcohol use: Yes    Comment: occ  . Drug use: No  . Sexual activity: Not on file  Lifestyle  . Physical activity:    Days per week: Not on file    Minutes per session: Not on file  . Stress: Not on file  Relationships  . Social connections:    Talks on phone: Not on file    Gets together: Not on file    Attends religious service: Not on file    Active member of club or organization: Not on file    Attends meetings  of clubs or organizations: Not on file    Relationship status: Not on file  . Intimate partner violence:    Fear of current or ex partner: Not on file    Emotionally abused: Not on file    Physically abused: Not on file    Forced sexual activity: Not on file  Other Topics Concern  . Not on file  Social History Narrative  . Not on file   Review of Systems Pertinent negatives listed in HPI Past Medical, Surgical Family and Social History reviewed and updated.  Objective:   Today's Vitals   02/14/19 1022  BP: 124/73  Pulse: 83  Resp: 17  Temp: 98.1 F (36.7 C)  TempSrc: Oral  SpO2: 94%  Weight: 290 lb 9.6 oz (131.8 kg)  Height: 5\' 6"  (1.676 m)    Wt Readings from Last 3 Encounters:  02/14/19 290 lb 9.6 oz (131.8 kg)  01/17/19 287 lb 12.8 oz (130.5 kg)  12/23/18 280 lb (127 kg)   Physical Exam Constitutional: Patient appears well-developed and well-nourished. No distress. HENT: Normocephalic, atraumatic, External right and left ear normal. Oropharynx is clear and moist.  Rhinorrhea present  Eyes: Conjunctivae and EOM are normal. PERRLA, no scleral icterus. Neck: Normal ROM. Neck supple. No JVD. No tracheal deviation. No thyromegaly. CVS: RRR, S1/S2 +, no murmurs, no gallops, no carotid bruit.  Pulmonary: Effort and breath sounds normal, no stridor, rhonchi, wheezes, rales.  Abdominal: Soft. BS +, no distension, tenderness, rebound or guarding.  Musculoskeletal: Normal range of motion. No edema and no tenderness.  Neuro: Alert. Normal reflexes, muscle tone coordination. No cranial nerve deficit. Skin: Skin is warm and dry. No rash noted. Not diaphoretic. No erythema. No pallor. Thicken right great toe significant for discoloration and thicken toe nail   Psychiatric: Normal mood and affect. Behavior, judgment, thought content normal.   Assessment & Plan:  1. Screening for blood or protein in urine - POCT URINALYSIS DIP (CLINITEK)  2. Prediabetes Last A1C 6.1.  -  Hemoglobin A1c  3. Pain of right hip joint -Patient has failed significant improvement with Meloxicam and recently prescribed tramadol without relief. Referral placed  to Orthopedic Surgery  4. IGTN (ingrowing toe nail) - Ambulatory referral to Podiatry  5. Toenail fungus -Referral placed to podiatry   6. Encounter for annual physical exam Age appropriate anticipatory guidance provided   7. Need for Tdap vaccination - Tdap vaccine greater than or equal to 7yo IM  8. Breast cancer screening by mammogram -ordered placed 3D mammogram.  9. Colon cancer screening- Cologuard No family history of colon cancer    Orders Placed This Encounter  Procedures  . Tdap vaccine greater than or equal to 7yo IM  . Hemoglobin A1c  . Ambulatory referral to Podiatry  Referral Priority:   Routine    Referral Type:   Consultation    Referral Reason:   Specialty Services Required    Requested Specialty:   Podiatry    Number of Visits Requested:   1  . Ambulatory referral to Orthopedic Surgery    Referral Priority:   Routine    Referral Type:   Surgical    Referral Reason:   Specialty Services Required    Requested Specialty:   Orthopedic Surgery    Number of Visits Requested:   1  . POCT URINALYSIS DIP (CLINITEK)    Patient will return in 6 weeks for anxiety follow-up and Pap visit.    Joaquin Courts, FNP-C [facility] [address] [place] [address] [phone]: [phone]

## 2019-02-14 NOTE — Patient Instructions (Signed)

## 2019-02-14 NOTE — Progress Notes (Deleted)
Established Patient Office Visit  Subjective:  Patient ID: Monica Duarte, female    DOB: Feb 18, 1956  Age: 63 y.o. MRN: [id number]  CC:  Chief Complaint  Patient presents with  . Annual Exam    HPI Monica Duarte is a 63 year old African American female that presents today for annual physical exam. She endorses continued pain of the right hip that radiates to lower right leg at times. She endorses that the pain is chronic and worst at night. She endorses that she has continued to take the mobic once daily and that she has completed her previous prescription for tramadol and experienced some minor relief.  She denies any chest pain, palpitations, or shortness of breath.  Past Medical History:  Diagnosis Date  . Asthma   . Obesity     Past Surgical History:  Procedure Laterality Date  . LAPAROSCOPIC GASTRIC BANDING     2009    No family history on file.  Social History   Socioeconomic History  . Marital status: Divorced    Spouse name: Not on file  . Number of children: Not on file  . Years of education: Not on file  . Highest education level: Not on file  Occupational History  . Not on file  Social Needs  . Financial resource strain: Not on file  . Food insecurity:    Worry: Not on file    Inability: Not on file  . Transportation needs:    Medical: Not on file    Non-medical: Not on file  Tobacco Use  . Smoking status: Never Smoker  . Smokeless tobacco: Never Used  Substance and Sexual Activity  . Alcohol use: Yes    Comment: occ  . Drug use: No  . Sexual activity: Not on file  Lifestyle  . Physical activity:    Days per week: Not on file    Minutes per session: Not on file  . Stress: Not on file  Relationships  . Social connections:    Talks on phone: Not on file    Gets together: Not on file    Attends religious service: Not on file    Active member of club or organization: Not on file    Attends meetings of clubs or organizations: Not  on file    Relationship status: Not on file  . Intimate partner violence:    Fear of current or ex partner: Not on file    Emotionally abused: Not on file    Physically abused: Not on file    Forced sexual activity: Not on file  Other Topics Concern  . Not on file  Social History Narrative  . Not on file    Outpatient Medications Prior to Visit  Medication Sig Dispense Refill  . cetirizine (ZYRTEC) 10 MG tablet Take 1 tablet (10 mg total) by mouth daily. 30 tablet 11  . fluconazole (DIFLUCAN) 200 MG tablet Take 1 tablet (200 mg total) by mouth once a week for 24 doses. 24 tablet 0  . montelukast (SINGULAIR) 10 MG tablet Take 1 tablet (10 mg total) by mouth at bedtime. 30 tablet 3  . Multiple Vitamin (MULTIVITAMIN WITH MINERALS) TABS tablet Take 1 tablet by mouth daily.    Marland Kitchen omeprazole (PRILOSEC) 40 MG capsule Take 1 capsule (40 mg total) by mouth daily. 30 capsule 3  . traMADol (ULTRAM) 50 MG tablet Take 1 tablet (50 mg total) by mouth every 6 (six) hours as needed for severe pain. 15  tablet 0  . triamcinolone ointment (KENALOG) 0.5 % Apply 1 application topically 2 (two) times daily. 90 g 1  . meloxicam (MOBIC) 15 MG tablet Take 1 tablet (15 mg total) by mouth daily. TAKE WITH MEALS 30 tablet 0  . oxybutynin (DITROPAN) 5 MG tablet Take 5 mg by mouth 3 (three) times daily.    . sertraline (ZOLOFT) 50 MG tablet Take 50 mg by mouth daily.     No facility-administered medications prior to visit.     Allergies  Allergen Reactions  . Sulfa Antibiotics Other (See Comments)    Gives pt mouth sores    ROS Review of Systems  Constitutional: Negative for appetite change, fatigue, fever and unexpected weight change.  HENT: Positive for rhinorrhea. Negative for congestion and sore throat.   Respiratory: Negative for cough, chest tightness, shortness of breath and wheezing.   Cardiovascular: Negative for chest pain, palpitations and leg swelling.  Gastrointestinal: Positive for vomiting.  Negative for abdominal pain, blood in stool and diarrhea.  Endocrine: Negative for cold intolerance, heat intolerance and polyuria.  Genitourinary: Negative for difficulty urinating, dysuria and flank pain.  Musculoskeletal: Positive for arthralgias. Negative for back pain, gait problem, neck pain and neck stiffness.       Right hip pain  Skin: Negative for color change and rash.  Neurological: Negative for light-headedness, numbness and headaches.  Psychiatric/Behavioral: The patient is nervous/anxious.       Objective:    Physical Exam  Constitutional: She is oriented to person, place, and time. She appears well-developed and well-nourished.  HENT:  Head: Normocephalic and atraumatic.  Right Ear: External ear normal.  Left Ear: External ear normal.  Nose: Nose normal.  Mouth/Throat: Oropharynx is clear and moist.  Eyes: Pupils are equal, round, and reactive to light. Conjunctivae and EOM are normal.  Neck: Normal range of motion. Neck supple. No thyromegaly present.  Cardiovascular: Normal rate, regular rhythm, normal heart sounds and intact distal pulses.  No murmur heard. Pulmonary/Chest: Effort normal and breath sounds normal. She has no wheezes.  Abdominal: Soft. Bowel sounds are normal. There is no abdominal tenderness.  Musculoskeletal: Normal range of motion.  Neurological: She is alert and oriented to person, place, and time. She has normal reflexes.  Skin: Skin is warm and dry.     Psychiatric: She has a normal mood and affect. Her behavior is normal. Judgment and thought content normal.    BP 124/73   Pulse 83   Temp 98.1 F (36.7 C) (Oral)   Resp 17   Ht  (1.676 m)   Wt 290 lb 9.6 oz (131.8 kg)   LMP 02/09/2014   SpO2 94%   BMI 46.90 kg/m  Wt Readings from Last 3 Encounters:  02/14/19 290 lb 9.6 oz (131.8 kg)  01/17/19 287 lb 12.8 oz (130.5 kg)  12/23/18 280 lb (127 kg)     Health Maintenance Due  Topic Date Due  . Hepatitis C Screening   05/23/1956  . HIV Screening  11/01/1973  . TETANUS/TDAP  11/01/1977  . MAMMOGRAM  11/01/2008  . COLONOSCOPY  11/01/2008  . PAP SMEAR-Modifier  05/24/2015    There are no preventive care reminders to display for this patient.  Lab Results  Component Value Date   TSH 1.050 09/21/2018   Lab Results  Component Value Date   WBC 5.9 09/21/2018   HGB 14.0 09/21/2018   HCT 41.8 09/21/2018   MCV 80 09/21/2018   PLT 356 09/21/2018  Lab Results  Component Value Date   NA 143 09/21/2018   K 4.2 09/21/2018   CO2 25 09/21/2018   GLUCOSE 141 (H) 09/21/2018   BUN 11 09/21/2018   CREATININE 0.80 09/21/2018   BILITOT 0.2 09/21/2018   ALKPHOS 79 09/21/2018   AST 11 09/21/2018   ALT 13 09/21/2018   PROT 6.8 09/21/2018   ALBUMIN 4.0 09/21/2018   CALCIUM 9.4 09/21/2018   ANIONGAP 7 12/25/2015   Lab Results  Component Value Date   CHOL 150 09/21/2018   Lab Results  Component Value Date   HDL 54 09/21/2018   Lab Results  Component Value Date   LDLCALC 81 09/21/2018   Lab Results  Component Value Date   TRIG 77 09/21/2018   Lab Results  Component Value Date   CHOLHDL 2.8 09/21/2018   Lab Results  Component Value Date   HGBA1C 6.1 (H) 09/21/2018      Assessment & Plan:   Problem List Items Addressed This Visit    None    Visit Diagnoses    Prediabetes    -  Primary   Relevant Orders   Hemoglobin A1c   Screening for blood or protein in urine       Relevant Orders   POCT URINALYSIS DIP (CLINITEK) (Completed)   Pain of right hip joint       Relevant Orders   Ambulatory referral to Orthopedic Surgery   IGTN (ingrowing toe nail)       Relevant Orders   Ambulatory referral to Podiatry      Meds ordered this encounter  Medications  . oxybutynin (DITROPAN) 5 MG tablet    Sig: Take 1 tablet (5 mg total) by mouth 3 (three) times daily.    Dispense:  90 tablet    Refill:  3  . sertraline (ZOLOFT) 50 MG tablet    Sig: Take 1 tablet (50 mg total) by mouth daily.     Dispense:  30 tablet    Refill:  2  . meloxicam (MOBIC) 15 MG tablet    Sig: Take 1 tablet (15 mg total) by mouth daily. TAKE WITH MEALS    Dispense:  30 tablet    Refill:  0  . cyclobenzaprine (FLEXERIL) 10 MG tablet    Sig: Take 1 tablet (10 mg total) by mouth 3 (three) times daily as needed for muscle spasms.    Dispense:  30 tablet    Refill:  0   - referral to ortho for persistent chronic right hip pain - referral to podiatry for fungal infection of right great toe - referral to in house counselor for anxiety   Follow-up: Return in about 6 weeks (around 03/28/2019) for Anxiety follow-up medication evaluation; & schedule with jasmine.    Vincent Gros, RN

## 2019-02-15 LAB — HEMOGLOBIN A1C
Est. average glucose Bld gHb Est-mCnc: 134 mg/dL
Hgb A1c MFr Bld: 6.3 % — ABNORMAL HIGH (ref 4.8–5.6)

## 2019-02-18 ENCOUNTER — Other Ambulatory Visit: Payer: Self-pay | Admitting: Family Medicine

## 2019-02-18 ENCOUNTER — Telehealth: Payer: Self-pay | Admitting: Family Medicine

## 2019-02-18 MED ORDER — METFORMIN HCL 500 MG PO TABS: 500.0000 mg | ORAL_TABLET | Freq: Every day | ORAL | 3 refills | Status: DC

## 2019-02-18 MED FILL — metFORMIN HCL 500 MG TABS: 500 | 30 days supply | Qty: 30 | Fill #0

## 2019-02-18 NOTE — Telephone Encounter (Signed)
Patient notified of lab results & recommendations. Expressed understanding. Is agreeable to starting Metformin. Rx released to [facility].

## 2019-02-18 NOTE — Telephone Encounter (Signed)
Spoke with patient and informed her of Mammogram ordered at [facility], will mail her the information card for the [facility] as well as [employer] information for Health maintenance.

## 2019-02-18 NOTE — Telephone Encounter (Signed)
Left voice mail to call back 

## 2019-02-18 NOTE — Telephone Encounter (Signed)
Recent labs indicate an increase in hemoglobin A1C from 6.1 to 6.3. I would recommend starting once daily metformin 500 mg to reduce the risk for developing diabetes.  I also recommend lifestyle changes such as engaging in routine physical activity with a goal of 150 minutes per week, increasing intake of vegetables, fruits, fiber, and selecting lean cuts of meat.

## 2019-02-26 ENCOUNTER — Ambulatory Visit (INDEPENDENT_AMBULATORY_CARE_PROVIDER_SITE_OTHER): Payer: Medicaid Other | Admitting: Licensed Clinical Social Worker

## 2019-02-26 ENCOUNTER — Other Ambulatory Visit: Payer: Self-pay

## 2019-02-26 DIAGNOSIS — F331 Major depressive disorder, recurrent, moderate: Secondary | ICD-10-CM

## 2019-02-26 DIAGNOSIS — F419 Anxiety disorder, unspecified: Secondary | ICD-10-CM

## 2019-03-01 ENCOUNTER — Ambulatory Visit (INDEPENDENT_AMBULATORY_CARE_PROVIDER_SITE_OTHER): Payer: Self-pay | Admitting: Orthopaedic Surgery

## 2019-03-04 NOTE — BH Specialist Note (Signed)
Integrated Behavioral Health Initial Visit  MRN: [id number] Name: Monica Duarte  Number of Integrated Behavioral Health Clinician visits:: 1/6 Session Start time: 2:20 PM  Session End time: 3:20 PM Total time: 1 hour  Type of Service: Integrated Behavioral Health- Individual Interpretor:No. Interpretor Name and Language: NA   SUBJECTIVE: Monica Duarte is a 63 y.o. female accompanied by self Patient was referred by FNP Tiburcio Pea for depression and anxiety. Patient reports the following symptoms/concerns: Pt has difficulty managing chronic pain and mental health due to psychosocial stressors Duration of problem: Pt reports diagnosis of Manic Depression and Anxiety in 2015; Severity of problem: severe  OBJECTIVE: Mood: Anxious and Depressed and Affect: Appropriate Risk of harm to self or others: No plan to harm self or others  LIFE CONTEXT: Family and Social: Pt receives limited support. 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  School/Work: Pt receives food stamps ($334) and medicaid. She quit her job last week due to hostile working environment Self-Care: Pt shared that she was drinking alcohol (wine and beer) every night to assist with sleeping; however, decreased use significantly due to medication Life Changes: Pt has difficulty managing chronic pain and mental health due to psychosocial stressors  GOALS ADDRESSED: Patient will: 1. Reduce symptoms of: anxiety, depression and stress 2. Increase knowledge and/or ability of: coping skills and healthy habits  3. Demonstrate ability to: Increase healthy adjustment to current life circumstances, Increase adequate support systems for patient/family, Increase motivation to adhere to plan of care and Decrease self-medicating behaviors  INTERVENTIONS: Interventions utilized: Mindfulness or Management consultant, Supportive Counseling, Psychoeducation and/or Health Education and Link to [employer]   Standardized Assessments completed: GAD-7 and PHQ 2&9  ASSESSMENT: Patient currently experiencing depression and anxiety.  Pt has difficulty managing chronic pain and mental health due to psychosocial stressors. Pt reports diagnosis of Manic Depression and Anxiety in 2015. Pt receives limited support. 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 Denies suicidal/homocidal ideations.    Patient may benefit from psychotherapy. Pt is currently participating in medication management through PCP; however, is not taking medication regularly "I don't feel like it's working". LCSWA educated pt on the correlation between one's physical and mental health, in addition, to how stress can negatively impact health. Pt was successful in identifying triggers, such as, pain, financial strain, and caregiver stress.  Healthy coping skills were discussed to manage stressors and decrease symptoms. LCSWA educated pt on the importance of medication compliance. Supportive resources, including, support groups and respite care were provided.   PLAN: 1. Follow up with behavioral health clinician on : Pt was encouraged to contact LCSWA if symptoms worsen or fail to improve to schedule behavioral appointments at [facility]. 2. Behavioral recommendations: LCSWA recommends that pt apply healthy coping skills discussed and utilize provided resources. Pt is encouraged to schedule follow up appointment with LCSWA 3. Referral(s): Integrated Art gallery manager (In Clinic) and [employer] Resources:  Caregiver support groups/respite care 4. "From scale of 1-10, how likely are you to follow plan?": 10  Bridgett Larsson, LCSW 03/04/2019 1:05 PM

## 2019-03-05 ENCOUNTER — Ambulatory Visit (INDEPENDENT_AMBULATORY_CARE_PROVIDER_SITE_OTHER): Payer: Self-pay | Admitting: Orthopaedic Surgery

## 2019-03-08 ENCOUNTER — Ambulatory Visit (INDEPENDENT_AMBULATORY_CARE_PROVIDER_SITE_OTHER): Payer: Medicaid Other

## 2019-03-08 ENCOUNTER — Ambulatory Visit (INDEPENDENT_AMBULATORY_CARE_PROVIDER_SITE_OTHER): Payer: Medicaid Other | Admitting: Orthopaedic Surgery

## 2019-03-08 ENCOUNTER — Encounter (INDEPENDENT_AMBULATORY_CARE_PROVIDER_SITE_OTHER): Payer: Self-pay | Admitting: Orthopaedic Surgery

## 2019-03-08 ENCOUNTER — Other Ambulatory Visit: Payer: Self-pay

## 2019-03-08 VITALS — BP 121/79 | HR 75 | Ht 66.0 in | Wt 280.0 lb

## 2019-03-08 DIAGNOSIS — M545 Low back pain, unspecified: Secondary | ICD-10-CM

## 2019-03-08 DIAGNOSIS — G8929 Other chronic pain: Secondary | ICD-10-CM | POA: Diagnosis not present

## 2019-03-08 NOTE — Addendum Note (Signed)
Addended by: Wendi Maya on: 03/08/2019 10:53 AM   Modules accepted: Orders

## 2019-03-08 NOTE — Progress Notes (Signed)
Office Visit Note   Patient: Monica Duarte           Date of Birth: 1956-08-16           MRN: [id number] Visit Date: 03/08/2019              Requested by: Bing Neighbors, FNP [address] [place], [place] [address] PCP: Bing Neighbors, FNP   Assessment & Plan: Visit Diagnoses:  1. Chronic right-sided low back pain, unspecified whether sciatica present     Plan: Right hip pain appears to originate from the lumbar spine.  There are mild degenerative changes at L4-5 and L5-S1 on the plain films.  There is slight anterior listhesis of L3 on L4.  I think a course of physical therapy would be helpful.  Patient has had a prior gastric banding but only has lost about 60 pounds.  She is trying to work on her weight loss.  Long discussion regarding her weight and its effect on her joints including her back and need for exercise.  No evidence of any neurologic deficit.   Follow-Up Instructions: Return if symptoms worsen or fail to improve.   Orders:  Orders Placed This Encounter  Procedures  . XR Lumbar Spine 2-3 Views   No orders of the defined types were placed in this encounter.     Procedures: No procedures performed   Clinical Data: No additional findings.   Subjective: Chief Complaint  Patient presents with  . Right Hip - Pain  Patient presents today for right hip pain. She said that it has been hurting for a couple months. No known injury. She said that it hurts in her groin and radiates down to her ankle. She is taking Flexeril, Mobic, and Naproxen. She had x-rays taken in January. She states that her pain is staying the same in the last two months. No numbness or tingling down her right leg. Has had discomfort "forever".  Also has back pain pelvis which were reviewed on the PACS system.  There is no evidence of degenerative arthritis.  No bowel or bladder changes.  Had a left procedure for obesity over 10 years ago has only lost about 60 pounds.   Present BMI is 45 280 pounds.  No problems referable to the left lower extremity  HPI  Review of Systems   Objective: Vital Signs: BP 121/79   Pulse 75   Ht 5\' 6"  (1.676 m)   Wt 280 lb (127 kg)   LMP 02/09/2014   BMI 45.19 kg/m   Physical Exam Constitutional:      Appearance: She is well-developed.  Eyes:     Pupils: Pupils are equal, round, and reactive to light.  Pulmonary:     Effort: Pulmonary effort is normal.  Skin:    General: Skin is warm and dry.  Neurological:     Mental Status: She is alert and oriented to person, place, and time.  Psychiatric:        Behavior: Behavior normal.     Ortho Exam awake alert and oriented x3.  Comfortable sitting.  Straight leg raise negative bilaterally.  Painless range of motion both hips.  Neurologically intact.  Some mild percussible tenderness of lumbar spine.  No pain over the sacroiliac joints.  Pelvis appears to be level.  Large legs.  No significant tenderness over either greater trochanter  Specialty Comments:  No specialty comments available.  Imaging: Xr Lumbar Spine 2-3 Views  Result Date: 03/08/2019  Films of the lumbar spine obtained in AP and lateral projection.  There is no evidence of a scoliosis.  There is slight anterior listhesis of L3 on 4.  Disc spaces appear to be well-maintained.  There are facet joint changes at L4-5 and L5-S1 consistent with some arthritis.  No acute changes or evidence of compression fracture    PMFS History: Patient Active Problem List   Diagnosis Date Noted  . Chronic right-sided low back pain 03/08/2019  . Anxiety 09/21/2018  . Stress at home 09/21/2018  . Eczema 09/21/2018  . Arthritis 09/21/2018   Past Medical History:  Diagnosis Date  . Asthma   . Obesity     History reviewed. No pertinent family history.  Past Surgical History:  Procedure Laterality Date  . LAPAROSCOPIC GASTRIC BANDING     2009   Social History   Occupational History  . Not on file  Tobacco  Use  . Smoking status: Never Smoker  . Smokeless tobacco: Never Used  Substance and Sexual Activity  . Alcohol use: Yes    Comment: occ  . Drug use: No  . Sexual activity: Not on file

## 2019-03-11 MED FILL — MONTELUKAST SOD 10 MG TAB: 10 | 30 days supply | Qty: 30 | Fill #1

## 2019-03-28 ENCOUNTER — Ambulatory Visit: Payer: Medicaid Other | Admitting: Family Medicine

## 2019-04-26 ENCOUNTER — Other Ambulatory Visit: Payer: Self-pay | Admitting: Family Medicine

## 2019-04-26 MED FILL — OMEPRAZOLE DR 40 MG CAPSULE: 40 | 30 days supply | Qty: 30 | Fill #1

## 2019-04-26 MED FILL — MONTELUKAST SOD 10 MG TAB: 10 | 30 days supply | Qty: 30 | Fill #2

## 2019-04-29 MED FILL — MELOXICAM 15 MG TABLET: 15 | 30 days supply | Qty: 30 | Fill #0

## 2019-04-29 MED FILL — TRIAMCINOLONE 0.5% OINTMENT: 0.5 | 7 days supply | Qty: 15 | Fill #1

## 2019-05-09 ENCOUNTER — Ambulatory Visit: Payer: Medicaid Other | Attending: Orthopaedic Surgery | Admitting: Physical Therapy

## 2019-05-09 ENCOUNTER — Other Ambulatory Visit: Payer: Self-pay

## 2019-05-09 DIAGNOSIS — M545 Low back pain: Secondary | ICD-10-CM | POA: Insufficient documentation

## 2019-05-09 DIAGNOSIS — G8929 Other chronic pain: Secondary | ICD-10-CM | POA: Insufficient documentation

## 2019-05-09 NOTE — Therapy (Signed)
[facility] [address] [place], [place], [address] Phone: [phone]   Fax:  [phone]  Physical Therapy Evaluation  Patient Details  Name: Monica Duarte MRN: [id number] Date of Birth: [birth year] No data recorded  Encounter Date: 05/09/2019    Past Medical History:  Diagnosis Date  . Asthma   . Obesity     Past Surgical History:  Procedure Laterality Date  . LAPAROSCOPIC GASTRIC BANDING     2009    There were no vitals filed for this visit.       [facility] PT Assessment - 05/09/19 0001      Assessment   Medical Diagnosis  --   unable to perform eval due to late arrival of patient               Objective measurements completed on examination: See above findings.                             Patient will benefit from skilled therapeutic intervention in order to improve the following deficits and impairments:     Visit Diagnosis: Chronic right-sided low back pain, unspecified whether sciatica present     Problem List Patient Active Problem List   Diagnosis Date Noted  . Chronic right-sided low back pain 03/08/2019  . Anxiety 09/21/2018  . Stress at home 09/21/2018  . Eczema 09/21/2018  . Arthritis 09/21/2018    Lazarus Gowda, PT, DPT 05/09/19 3:07 PM  [facility] [address] [place], [place], [address] Phone: [phone]   Fax:  [phone]  Name: Monica Duarte MRN: [id number] Date of Birth: May 31, 1956

## 2019-05-24 ENCOUNTER — Telehealth: Payer: Self-pay | Admitting: Family Medicine

## 2019-05-24 NOTE — Telephone Encounter (Signed)
Patients call taken.  Patient identified by name and date of birth.  Patient complains of flu like symptoms.  Patient states she feels like she has body aches.  Patient states she has a headache.  Patient has not taken any medication for said headache.    Patient advised to take ibuprofen or tylenol for her headache and to drink plenty of water, get good nutrition to include leafy greens, and to get at least eight hours of sleep.  Patient also given a recipe for lemon juice, honey and hot water and explained the benefits of the ingredients.  Patient advised to call back on Monday to check in.  Patient advised that if symptoms did not improve then they should go to the Emergency Department or Urgent Care.  Patient acknowledged understanding of advice.

## 2019-05-24 NOTE — Telephone Encounter (Signed)
Patient states she has been feeling achy and believes she may have the flu. Please follow up.

## 2019-05-25 ENCOUNTER — Emergency Department (HOSPITAL_COMMUNITY): Payer: Medicaid Other

## 2019-05-25 ENCOUNTER — Emergency Department (HOSPITAL_COMMUNITY)
Admission: EM | Admit: 2019-05-25 | Discharge: 2019-05-25 | Disposition: A | Discharge status: Home / Self Care | Payer: Medicaid Other | Attending: Emergency Medicine | Admitting: Emergency Medicine

## 2019-05-25 ENCOUNTER — Encounter (HOSPITAL_COMMUNITY): Payer: Self-pay | Admitting: Emergency Medicine

## 2019-05-25 ENCOUNTER — Other Ambulatory Visit: Payer: Self-pay

## 2019-05-25 DIAGNOSIS — R51 Headache: Secondary | ICD-10-CM | POA: Insufficient documentation

## 2019-05-25 DIAGNOSIS — Z79899 Other long term (current) drug therapy: Secondary | ICD-10-CM | POA: Diagnosis not present

## 2019-05-25 DIAGNOSIS — Z7984 Long term (current) use of oral hypoglycemic drugs: Secondary | ICD-10-CM | POA: Diagnosis not present

## 2019-05-25 DIAGNOSIS — Z20828 Contact with and (suspected) exposure to other viral communicable diseases: Secondary | ICD-10-CM | POA: Diagnosis not present

## 2019-05-25 DIAGNOSIS — R509 Fever, unspecified: Secondary | ICD-10-CM | POA: Insufficient documentation

## 2019-05-25 DIAGNOSIS — J45909 Unspecified asthma, uncomplicated: Secondary | ICD-10-CM | POA: Diagnosis not present

## 2019-05-25 DIAGNOSIS — Z20822 Contact with and (suspected) exposure to covid-19: Secondary | ICD-10-CM

## 2019-05-25 MED ORDER — ACETAMINOPHEN 325 MG PO TABS
650.0000 mg | ORAL_TABLET | Freq: Once | ORAL | Status: AC | PRN
  Administered 2019-05-25: 22:00:00 650 mg via ORAL
  Filled 2019-05-25: qty 2

## 2019-05-25 NOTE — Discharge Instructions (Addendum)
It was my pleasure taking care of you today!   Fortunately, your x-ray was normal and your exam was reassuring.   Take 650mg -1000mg  of Tylenol every 8 hours as needed for fevers.  You can also take 800mg  of ibuprofen three times a day as needed for fevers, but you should not take this is you are taking Meloxicam (Mobic). I would suggest taking ibuprofen INSTEAD OF Meloxicam for the next few days for fever control.   Call your primary care doctor on Monday to schedule a follow-up appointment. I would assume that they would want to do this as a tele-visit or a phone call.  Return to the emergency department immediately if you develop any difficulty with your breathing, new or worsening symptoms, any additional concerns.

## 2019-05-25 NOTE — ED Triage Notes (Signed)
Pt reports having fever and chills for the last 3 days. Pt denies any cough. Pt reports not taking any medications for fever.

## 2019-05-25 NOTE — ED Notes (Signed)
Bed: WTR6 Expected date:  Expected time:  Means of arrival:  Comments: 

## 2019-05-25 NOTE — ED Notes (Signed)
Call sister when up for discharge

## 2019-05-25 NOTE — ED Provider Notes (Signed)
[facility]-EMERGENCY DEPT Provider Note   CSN: [id number] Arrival date & time: 05/25/19  2137    History   Chief Complaint Chief Complaint  Patient presents with   Fever    HPI Monica Duarte is a 63 y.o. female.     The history is provided by the patient and medical records. No language interpreter was used.  Fever Associated symptoms: chills and headaches    Monica Duarte is a 63 y.o. female  with a PMH of asthma, eczema, arthritis who presents to the Emergency Department complaining of fever and chills for the last 2 days. Tmax at home of 102.  She states that she takes Ultram and Mobic at home routinely for her arthritis and has been doing so.  She did not know if she was allowed to take other antipyretic medications in combination with this, so she has not.  She does report having a throbbing frontal headache yesterday for a few hours, but has not had any headaches today.  Never had any numbness, weakness or visual changes.  No neck pain.  She denies any known COVID exposure.  She has only been going to the grocery store.  She denies cough, shortness of breath, abdominal pain, nausea, vomiting, diarrhea, dysuria, urinary urgency/frequency.  Other than headache, fever, chills no other complaints.  Past Medical History:  Diagnosis Date   Asthma    Obesity     Patient Active Problem List   Diagnosis Date Noted   Chronic right-sided low back pain 03/08/2019   Anxiety 09/21/2018   Stress at home 09/21/2018   Eczema 09/21/2018   Arthritis 09/21/2018    Past Surgical History:  Procedure Laterality Date   LAPAROSCOPIC GASTRIC BANDING     2009     OB History   No obstetric history on file.      Home Medications    Prior to Admission medications   Medication Sig Start Date End Date Taking? Authorizing Provider  cetirizine (ZYRTEC) 10 MG tablet Take 1 tablet (10 mg total) by mouth daily. 01/17/19   Bing NeighborsHarris, Kimberly S, FNP    cyclobenzaprine (FLEXERIL) 10 MG tablet Take 1 tablet (10 mg total) by mouth 3 (three) times daily as needed for muscle spasms. 02/14/19   Bing NeighborsHarris, Kimberly S, FNP  fluconazole (DIFLUCAN) 200 MG tablet Take 1 tablet (200 mg total) by mouth once a week for 24 doses. 12/21/18 06/01/19  Wieters, Hallie C, PA-C  meloxicam (MOBIC) 15 MG tablet TAKE 1 TABLET (15 MG TOTAL) BY MOUTH DAILY. TAKE WITH MEALS 04/28/19   Bing NeighborsHarris, Kimberly S, FNP  metFORMIN (GLUCOPHAGE) 500 MG tablet Take 1 tablet (500 mg total) by mouth daily with breakfast. 02/18/19   Bing NeighborsHarris, Kimberly S, FNP  montelukast (SINGULAIR) 10 MG tablet Take 1 tablet (10 mg total) by mouth at bedtime. 01/17/19   Bing NeighborsHarris, Kimberly S, FNP  Multiple Vitamin (MULTIVITAMIN WITH MINERALS) TABS tablet Take 1 tablet by mouth daily.    [provider]  omeprazole (PRILOSEC) 40 MG capsule Take 1 capsule (40 mg total) by mouth daily. 01/17/19   Bing NeighborsHarris, Kimberly S, FNP  oxybutynin (DITROPAN) 5 MG tablet Take 1 tablet (5 mg total) by mouth 3 (three) times daily. 02/14/19   Bing NeighborsHarris, Kimberly S, FNP  sertraline (ZOLOFT) 50 MG tablet Take 1 tablet (50 mg total) by mouth daily. 02/14/19   Bing NeighborsHarris, Kimberly S, FNP  traMADol (ULTRAM) 50 MG tablet Take 1 tablet (50 mg total) by mouth every 6 (six)  hours as needed for severe pain. 12/21/18   Wieters, Hallie C, PA-C  triamcinolone ointment (KENALOG) 0.5 % Apply 1 application topically 2 (two) times daily. 01/17/19   Scot Jun, FNP    Family History History reviewed. No pertinent family history.  Social History Social History   Tobacco Use   Smoking status: Never Smoker   Smokeless tobacco: Never Used  Substance Use Topics   Alcohol use: Yes    Comment: occ   Drug use: No     Allergies   Metformin and related and Sulfa antibiotics   Review of Systems Review of Systems  Constitutional: Positive for chills and fever.  Neurological: Positive for headaches. Negative for dizziness, syncope, speech difficulty,  weakness, light-headedness and numbness.  All other systems reviewed and are negative.    Physical Exam Updated Vital Signs BP 119/90 (BP Location: Left Arm)    Pulse 80    Temp 99.7 F (37.6 C) (Oral)    Resp 19    Ht 5\' 5"  (1.651 m)    Wt 117.9 kg    LMP 02/09/2014    SpO2 100%    BMI 43.27 kg/m   Physical Exam Vitals signs and nursing note reviewed.  Constitutional:      General: She is not in acute distress.    Appearance: She is well-developed.     Comments: Nontoxic-appearing.  HENT:     Head: Normocephalic and atraumatic.  Neck:     Musculoskeletal: Neck supple.     Comments: No nuchal rigidity.  No meningeal signs. Cardiovascular:     Rate and Rhythm: Normal rate and regular rhythm.     Heart sounds: Normal heart sounds. No murmur.  Pulmonary:     Effort: Pulmonary effort is normal. No respiratory distress.     Breath sounds: Normal breath sounds.     Comments: Lungs clear to auscultation bilaterally.  Speaking full sentences without any difficulty. Abdominal:     General: There is no distension.     Palpations: Abdomen is soft.     Comments: No abdominal tenderness.  Skin:    General: Skin is warm and dry.  Neurological:     Mental Status: She is alert and oriented to person, place, and time.     Comments: Speech clear and goal oriented. CN 2-12 grossly intact. Steady gait.       ED Treatments / Results  Labs (all labs ordered are listed, but only abnormal results are displayed) Labs Reviewed  NOVEL CORONAVIRUS, NAA (HOSPITAL ORDER, SEND-OUT TO REF LAB)    EKG None  Radiology Dg Chest Portable 1 View  Result Date: 05/25/2019 CLINICAL DATA:  Fever, chills EXAM: PORTABLE CHEST 1 VIEW COMPARISON:  12/25/2015 FINDINGS: Heart and mediastinal contours are within normal limits. No focal opacities or effusions. No acute bony abnormality. IMPRESSION: No active disease. Electronically Signed   By: Rolm Baptise M.D.   On: 05/25/2019 22:44     Procedures Procedures (including critical care time)  Medications Ordered in ED Medications  acetaminophen (TYLENOL) tablet 650 mg (650 mg Oral Given 05/25/19 2208)     Initial Impression / Assessment and Plan / ED Course  I have reviewed the triage vital signs and the nursing notes.  Pertinent labs & imaging results that were available during my care of the patient were reviewed by me and considered in my medical decision making (see chart for details).       LATESIA NORRINGTON is a 63 y.o. female  who presents to ED for fever and chills for the last 2 to 3 days.  She reports having a frontal, pounding headache yesterday, but has had no headaches today.  No neck pain.  No nuchal rigidity or meningeal signs to suggest meningitis on exam.  Normal neurologic exam.  She was febrile at 101.2 upon arrival.  Lungs clear.  Oxygenating well.  No urinary symptoms to suggest UTI.  Abdomen nontender.  Chest x-ray obtained with no acute cardiopulmonary disease.  Overall, she appears quite well.  COVID test obtained.  She is aware that results will take 24-48 hours.  Recommended isolation self quarantine until results are back.  Discussed home care instructions including antipyretics as well.  Reasons to return to the emergency department were discussed.  PCP check in on Monday recommended.  All questions answered.  Patient discussed with Dr. Juleen ChinaKohut who agrees with treatment plan.    Final Clinical Impressions(s) / ED Diagnoses   Final diagnoses:  Suspected Covid-19 Virus Infection    ED Discharge Orders    None       Bria Portales, Chase PicketJaime Pilcher, PA-C 05/25/19 2316    Raeford RazorKohut, Stephen, MD 05/27/19 1616

## 2019-05-26 LAB — NOVEL CORONAVIRUS, NAA (HOSP ORDER, SEND-OUT TO REF LAB; TAT 18-24 HRS): SARS-CoV-2, NAA: NOT DETECTED

## 2019-05-29 ENCOUNTER — Other Ambulatory Visit: Payer: Self-pay

## 2019-05-29 ENCOUNTER — Ambulatory Visit (INDEPENDENT_AMBULATORY_CARE_PROVIDER_SITE_OTHER): Payer: Medicaid Other | Admitting: Family Medicine

## 2019-05-29 DIAGNOSIS — M5489 Other dorsalgia: Secondary | ICD-10-CM

## 2019-05-29 DIAGNOSIS — B349 Viral infection, unspecified: Secondary | ICD-10-CM | POA: Diagnosis not present

## 2019-05-29 DIAGNOSIS — R7303 Prediabetes: Secondary | ICD-10-CM

## 2019-05-29 DIAGNOSIS — R638 Other symptoms and signs concerning food and fluid intake: Secondary | ICD-10-CM | POA: Diagnosis not present

## 2019-05-29 DIAGNOSIS — R509 Fever, unspecified: Secondary | ICD-10-CM | POA: Diagnosis not present

## 2019-05-29 NOTE — Progress Notes (Signed)
Virtual Visit via Telephone Note  I connected with Monica Duarte on 05/29/19 at  3:50 PM EDT by telephone and verified that I am speaking with the correct person using two identifiers.  Location: Patient: Located at home during today's encounter  Provider: Located at primary care office     I discussed the limitations, risks, security and privacy concerns of performing an evaluation and management service by telephone and the availability of in person appointments. I also discussed with the patient that there may be a patient responsible charge related to this service. The patient expressed understanding and agreed to proceed.   History of Present Illness: Patient complains of 4 days of fever, chills, and poor appetite. Reports fever 3 days ago TMAX 100. She presented to the ER with symptoms and was COVID 19 tested which was negative. She has been without fever, chills, or body aches since Monday. Endorses a continued poor appetite and back pain. She suffers from prediabetes and recent A1C had increased 6.3. She has a meter at home and doesn't check blood sugar. She also reports stopping metformin over 1 month ago as medication caused her mouth to feel strange. Denies increased thirst or polyuria. Last A1C completed 02/14/19.  Assessment and Plan: 1. Viral illness of unknown etiology Recent COVID-19 testing negative. Encouraged rest and symptomatic management.   2. Prediabetes Stopped Metformin over one month ago. Last A1C 6.3. Concern for possible poor glycemic control . Patient will return in 2 weeks for in person visit to recheck A1C.  Follow Up Instructions: Follow-up in 2 weeks for routine labs, overdue health maintenance, and depression     I discussed the assessment and treatment plan with the patient. The patient was provided an opportunity to ask questions and all were answered. The patient agreed with the plan and demonstrated an understanding of the instructions.   The  patient was advised to call back or seek an in-person evaluation if the symptoms worsen or if the condition fails to improve as anticipated.  I provided 20 minutes of non-face-to-face time during this encounter.   Molli Barrows, FNP-C

## 2019-06-10 MED FILL — TRIAMCINOLONE 0.5% OINTMENT: 0.5 | 7 days supply | Qty: 15 | Fill #2

## 2019-06-17 ENCOUNTER — Encounter: Payer: Self-pay | Admitting: Family Medicine

## 2019-06-19 ENCOUNTER — Other Ambulatory Visit: Payer: Self-pay | Admitting: Family Medicine

## 2019-06-19 DIAGNOSIS — R7303 Prediabetes: Secondary | ICD-10-CM

## 2019-06-19 NOTE — Progress Notes (Signed)
CMP and A1C ordered for lab visit.

## 2019-06-20 ENCOUNTER — Ambulatory Visit: Payer: Medicaid Other | Admitting: Family Medicine

## 2019-06-20 ENCOUNTER — Ambulatory Visit: Payer: Medicaid Other

## 2019-06-25 ENCOUNTER — Other Ambulatory Visit: Payer: Self-pay

## 2019-06-25 ENCOUNTER — Telehealth: Payer: Self-pay | Admitting: Family Medicine

## 2019-06-25 ENCOUNTER — Ambulatory Visit (INDEPENDENT_AMBULATORY_CARE_PROVIDER_SITE_OTHER): Payer: Medicaid Other | Admitting: Family Medicine

## 2019-06-25 ENCOUNTER — Encounter: Payer: Self-pay | Admitting: Family Medicine

## 2019-06-25 DIAGNOSIS — F331 Major depressive disorder, recurrent, moderate: Secondary | ICD-10-CM | POA: Diagnosis not present

## 2019-06-25 DIAGNOSIS — R457 State of emotional shock and stress, unspecified: Secondary | ICD-10-CM

## 2019-06-25 DIAGNOSIS — F411 Generalized anxiety disorder: Secondary | ICD-10-CM | POA: Diagnosis not present

## 2019-06-25 MED ORDER — OMEPRAZOLE 40 MG PO CPDR: 40.0000 mg | DELAYED_RELEASE_CAPSULE | Freq: Every day | ORAL | 3 refills | Status: DC

## 2019-06-25 MED ORDER — TRIAMCINOLONE ACETONIDE 0.5 % EX OINT: 1.0000 "application " | TOPICAL_OINTMENT | Freq: Two times a day (BID) | CUTANEOUS | 3 refills | Status: DC

## 2019-06-25 MED FILL — TRIAMCINOLONE 0.5% OINTMENT: 0.5 | 15 days supply | Qty: 30 | Fill #0

## 2019-06-25 MED FILL — OMEPRAZOLE DR 40 MG CAPSULE: 40 | 30 days supply | Qty: 30 | Fill #0

## 2019-06-25 NOTE — Telephone Encounter (Signed)
See my office note from today's office visit.  Advised patient I would have you reach out to her via phone to share any additional resources for respite care available for patient's mother.

## 2019-06-25 NOTE — Progress Notes (Deleted)
Worked up patient for their telephone visit with provider Molli Barrows, FNP-C. Verified date of birth. Patient states that she doesn't think that the Zoloft is as effective anymore. Would also like refills on the Kenalog ointment. Would like a bigger container though. KWalker, CMA.

## 2019-06-25 NOTE — Progress Notes (Deleted)
   She more irritable     GAD 7 : Generalized Anxiety Score 06/25/2019 02/26/2019 09/20/2018  Nervous, Anxious, on Edge 2 2 3   Control/stop worrying 2 2 3   Worry too much - different things 2 2 3   Trouble relaxing 2 3 2   Restless 0 0 1  Easily annoyed or irritable 2 3 2   Afraid - awful might happen 1 1 2   Total GAD 7 Score 11 13 16      Depression screen [facility] 2/9 06/25/2019 02/26/2019 09/20/2018  Decreased Interest 2 2 1   Down, Depressed, Hopeless 2 3 1   PHQ - 2 Score 4 5 2   Altered sleeping 2 3 2   Tired, decreased energy 2 2 2   Change in appetite 2 3 3   Feeling bad or failure about yourself  2 3 2   Trouble concentrating 2 3 3   Moving slowly or fidgety/restless 0 0 0  Suicidal thoughts 0 0 0  PHQ-9 Score 14 19 14

## 2019-06-25 NOTE — Progress Notes (Deleted)
Patient ID: Monica Duarte, female    DOB: 1956/05/11, 63 y.o.   MRN: [id number]  PCP: Scot Jun, FNP  Chief Complaint  Patient presents with  . prediabetes  . Depression  . Anxiety    Subjective:  HPI Monica Duarte is a 63 y.o. female presents for evaluation general anxiety disorder and major depression disorder.  Monica Duarte has Anxiety; Stress at home; Eczema; Arthritis; and Chronic right-sided low back pain on their problem list.   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 She feels very stressed and irritable. She endorses increased irritability and frustrated. She is unable to sleep due fears related to  her mother wandering throughout the night around the house.  She has 3 siiblings who are uninvolved in her mother's care. She has been seen previously by her counselor here in office Christa See who recommended respite care. At the time she was not open to the suggestion however she is now open.  Her mother has Medicaid and has been told by her mother's PCP that she is eligible for services through her insurance.`she is taking the Zoloft previously prescribed. Doesn't feel she needs any additional medications. Denies self harm or thoughts of harming others.   GAD 7 : Generalized Anxiety Score 06/25/2019 02/26/2019 09/20/2018  Nervous, Anxious, on Edge 2 2 3   Control/stop worrying 2 2 3   Worry too much - different things 2 2 3   Trouble relaxing 2 3 2   Restless 0 0 1  Easily annoyed or irritable 2 3 2   Afraid - awful might happen 1 1 2   Total GAD 7 Score 11 13 16      Depression screen [facility] 2/9 06/25/2019 02/26/2019 09/20/2018  Decreased Interest 2 2 1   Down, Depressed, Hopeless 2 3 1   PHQ - 2 Score 4 5 2   Altered sleeping 2 3 2   Tired, decreased energy 2 2 2   Change in appetite 2 3 3   Feeling bad or failure about yourself  2 3 2   Trouble concentrating 2 3 3   Moving  slowly or fidgety/restless 0 0 0  Suicidal thoughts 0 0 0  PHQ-9 Score 14 19 14       Social History   Socioeconomic History  . Marital status: Divorced    Spouse name: Not on file  . Number of children: Not on file  . Years of education: Not on file  . Highest education level: Not on file  Occupational History  . Not on file  Social Needs  . Financial resource strain: Not on file  . Food insecurity    Worry: Not on file    Inability: Not on file  . Transportation needs    Medical: Not on file    Non-medical: Not on file  Tobacco Use  . Smoking status: Never Smoker  . Smokeless tobacco: Never Used  Substance and Sexual Activity  . Alcohol use: Yes    Comment: occ  . Drug use: No  . Sexual activity: Not on file  Lifestyle  . Physical activity    Days per week: Not on file    Minutes per session: Not on file  . Stress: Not on file  Relationships  . Social Herbalist on phone: Not on file    Gets together: Not on file    Attends religious service: Not on file    Active member of club or  organization: Not on file    Attends meetings of clubs or organizations: Not on file    Relationship status: Not on file  . Intimate partner violence    Fear of current or ex partner: Not on file    Emotionally abused: Not on file    Physically abused: Not on file    Forced sexual activity: Not on file  Other Topics Concern  . Not on file  Social History Narrative  . Not on file    Family History  Family history unknown: Yes   Review of Systems Pertinent negatives listed in HPI Allergies  Allergen Reactions  . Metformin And Related Other (See Comments)    Mouth soreness  . Sulfa Antibiotics Other (See Comments)    Gives pt mouth sores    Prior to Admission medications   Medication Sig Start Date End Date Taking? Authorizing Provider  meloxicam (MOBIC) 15 MG tablet TAKE 1 TABLET (15 MG TOTAL) BY MOUTH DAILY. TAKE WITH MEALS 04/28/19  Yes Bing NeighborsHarris, Cintia Gleed S,  FNP  montelukast (SINGULAIR) 10 MG tablet Take 1 tablet (10 mg total) by mouth at bedtime. 01/17/19  Yes Bing NeighborsHarris, Lain Tetterton S, FNP  omeprazole (PRILOSEC) 40 MG capsule Take 1 capsule (40 mg total) by mouth daily. 06/25/19  Yes Bing NeighborsHarris, Gerry Blanchfield S, FNP  sertraline (ZOLOFT) 100 MG tablet Take 100 mg by mouth daily. 05/09/19  Yes [provider]  triamcinolone ointment (KENALOG) 0.5 % Apply 1 application topically 2 (two) times daily. 06/25/19  Yes Bing NeighborsHarris, Leshea Jaggers S, FNP  Multiple Vitamin (MULTIVITAMIN WITH MINERALS) TABS tablet Take 1 tablet by mouth daily.    [provider]  oxybutynin (DITROPAN) 5 MG tablet Take 1 tablet (5 mg total) by mouth 3 (three) times daily. Patient not taking: Reported on 06/25/2019 02/14/19   Bing NeighborsHarris, Anaiya Wisinski S, FNP    Past Medical, Surgical Family and Social History reviewed and updated.    Objective:  There were no vitals filed for this visit.  BP Readings from Last 3 Encounters:  05/25/19 119/90  03/08/19 121/79  02/14/19 124/73    There were no vitals filed for this visit.     Physical Exam General appearance: alert, well developed, well nourished, cooperative and in no distress Head: Normocephalic, without obvious abnormality, atraumatic Respiratory: Respirations even and unlabored, normal respiratory rate Heart: rate and rhythm normal. No gallop or murmurs noted on exam  Extremities: No gross deformities Skin: Skin color, texture, turgor normal. No rashes seen  Psych: Appropriate mood and affect. Neurologic: Mental status: Alert, oriented to person, place, and time, thought content appropriate.  No results found for: POCGLU  Lab Results  Component Value Date   HGBA1C 6.3 (H) 02/14/2019            Assessment & Plan:  1. Screening for breast cancer *** - MM 3D SCREEN BREAST BILATERAL; Future      Joaquin CourtsKimberly Naila Elizondo, FNP [facility] [address], [place] [id number] [phone]:  [phone]

## 2019-06-25 NOTE — Progress Notes (Signed)
Virtual Visit via Telephone Note  I connected with Monica Duarte on 06/25/19 at 11:10 AM EDT by telephone and verified that I am speaking with the correct person using two identifiers.  Location: Patient: Located at home during today's encounter  Provider: Located at primary care office     I discussed the limitations, risks, security and privacy concerns of performing an evaluation and management service by telephone and the availability of in person appointments. I also discussed with the patient that there may be a patient responsible charge related to this service. The patient expressed understanding and agreed to proceed.   History of Present Illness: Monica Duarte is a 63 y.o. female presents for evaluation general anxiety disorder and major depression disorder.  Monica Duarte has Anxiety; Stress at home; Eczema; Arthritis; and Chronic right-sided low back pain on their problem list.   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 She feels very stressed and irritable. She endorses increased irritability and frustrated. She is unable to sleep due fears related to  her mother wandering throughout the night around the house.  She has 3 siiblings who are uninvolved in her mother's care. She has been seen previously by her counselor here in office Monica Duarte who recommended respite care. At the time she was not open to the suggestion however she is now open.  Her mother has Medicaid and has been told by her mother's PCP that she is eligible for services through her insurance.`she is taking the Zoloft previously prescribed. Doesn't feel she needs any additional medications. Denies self harm or thoughts of harming others.  GAD 7 : Generalized Anxiety Score 06/25/2019 02/26/2019 09/20/2018  Nervous, Anxious, on Edge 2 2 3   Control/stop worrying 2 2 3   Worry too much - different things 2 2 3    Trouble relaxing 2 3 2   Restless 0 0 1  Easily annoyed or irritable 2 3 2   Afraid - awful might happen 1 1 2   Total GAD 7 Score 11 13 16     Depression screen [facility] 2/9 06/25/2019 02/26/2019 09/20/2018  Decreased Interest 2 2 1   Down, Depressed, Hopeless 2 3 1   PHQ - 2 Score 4 5 2   Altered sleeping 2 3 2   Tired, decreased energy 2 2 2   Change in appetite 2 3 3   Feeling bad or failure about yourself  2 3 2   Trouble concentrating 2 3 3   Moving slowly or fidgety/restless 0 0 0  Suicidal thoughts 0 0 0  PHQ-9 Score 14 19 14      Assessment and Plan: 1. Moderate episode of recurrent major depressive disorder (HCC) 2. Generalized anxiety disorder 3. Caregiver stress syndrome -For now we will continue the sertraline 100 mg once daily.  Encourage patient to reach out for available resources as she requires some form of assistance and respite care services that would provide her time to promote self-care activities.  Discussed the disease process of Alzheimer's and how it alters behaviors along with cognition.  Also advised patient I would reach out to our social worker to have her follow-up with her via phone to discuss any additional available resources that may be of benefit to her.  Patient was thankful and verbalized understanding and agreement and will reach out if she desires to have medications management adjusted. Follow Up Instructions: Patient will schedule a lab appointment in 4 to 6 weeks to provide time for patient  to arrange care for her mother. Labs are pending she will need an A1c, and CMP. I discussed the assessment and treatment plan with the patient. The patient was provided an opportunity to ask questions and all were answered. The patient agreed with the plan and demonstrated an understanding of the instructions.   The patient was advised to call back or seek an in-person evaluation if the symptoms worsen or if the condition fails to improve as anticipated.  I provided 25  minutes of non-face-to-face time during this encounter.   Molli Barrows, FNP

## 2019-07-09 ENCOUNTER — Encounter: Payer: Self-pay | Admitting: Licensed Clinical Social Worker

## 2019-07-09 NOTE — Telephone Encounter (Signed)
Call placed to patient to follow up on consult from PCP to address resources. LCSW left message for a return call.

## 2019-07-29 MED FILL — MELOXICAM 15 MG TABLET: 15 | 30 days supply | Qty: 30 | Fill #1

## 2019-07-29 MED FILL — TRIAMCINOLONE 0.5% OINTMENT: 0.5 | 15 days supply | Qty: 30 | Fill #1

## 2019-08-12 ENCOUNTER — Encounter (HOSPITAL_COMMUNITY): Payer: Self-pay

## 2019-08-12 ENCOUNTER — Emergency Department (HOSPITAL_COMMUNITY)
Admission: EM | Admit: 2019-08-12 | Discharge: 2019-08-12 | Disposition: A | Discharge status: Home / Self Care | Payer: Medicaid Other | Attending: Emergency Medicine | Admitting: Emergency Medicine

## 2019-08-12 ENCOUNTER — Other Ambulatory Visit: Payer: Self-pay

## 2019-08-12 DIAGNOSIS — L299 Pruritus, unspecified: Secondary | ICD-10-CM | POA: Diagnosis present

## 2019-08-12 DIAGNOSIS — Z79899 Other long term (current) drug therapy: Secondary | ICD-10-CM | POA: Insufficient documentation

## 2019-08-12 DIAGNOSIS — J45909 Unspecified asthma, uncomplicated: Secondary | ICD-10-CM | POA: Insufficient documentation

## 2019-08-12 DIAGNOSIS — T7840XA Allergy, unspecified, initial encounter: Secondary | ICD-10-CM | POA: Diagnosis not present

## 2019-08-12 MED ORDER — DIPHENHYDRAMINE HCL 25 MG PO CAPS
25.0000 mg | ORAL_CAPSULE | Freq: Once | ORAL | Status: AC
  Administered 2019-08-12: 03:00:00 25 mg via ORAL
  Filled 2019-08-12: qty 1

## 2019-08-12 MED ORDER — PREDNISONE 20 MG PO TABS
60.0000 mg | ORAL_TABLET | Freq: Once | ORAL | Status: AC
  Administered 2019-08-12: 03:00:00 60 mg via ORAL
  Filled 2019-08-12: qty 3

## 2019-08-12 MED ORDER — PREDNISONE 20 MG PO TABS: 40.0000 mg | ORAL_TABLET | Freq: Every day | ORAL | 0 refills | Status: DC

## 2019-08-12 MED ORDER — FAMOTIDINE 20 MG PO TABS: 20.0000 mg | ORAL_TABLET | Freq: Two times a day (BID) | ORAL | 0 refills | Status: DC

## 2019-08-12 MED ORDER — CETIRIZINE HCL 10 MG PO TABS: 10.0000 mg | ORAL_TABLET | Freq: Two times a day (BID) | ORAL | 0 refills | Status: DC

## 2019-08-12 MED ORDER — FAMOTIDINE 20 MG PO TABS
20.0000 mg | ORAL_TABLET | Freq: Once | ORAL | Status: AC
  Administered 2019-08-12: 03:00:00 20 mg via ORAL
  Filled 2019-08-12: qty 1

## 2019-08-12 MED FILL — CETIRIZINE HCL 10 MG TABS: 10 | 5 days supply | Qty: 10 | Fill #0

## 2019-08-12 MED FILL — predniSONE 20 MG TABS: 20 | 5 days supply | Qty: 10 | Fill #0

## 2019-08-12 NOTE — ED Provider Notes (Signed)
[facility] DEPT Provider Note   CSN: [id number] Arrival date & time: 08/12/19  0128     History   Chief Complaint Chief Complaint  Patient presents with  . Pruritis    HPI Monica Duarte is a 63 y.o. female with a hx of asthma, obesity, chronic pain, eczema presents to the Emergency Department complaining of gradual, persistent, itching over her entire body onset approximately 30 hours ago.  Patient reports she had run out of her home pain medications and took a [employer] brand pain reliever.  She reports this was an over-the-counter medication however she does not know what was in it.  She reports she has never taken it before.  She reports taking 3 tablets initially and then several hours later taking an additional 2 tablets.  Sometime after that she began to notice welts all over her body and severe itching.  She reports taking 1 dose of Benadryl which helped minimally however symptoms returned.  Patient reports using calamine lotion without improvement.  Nothing seems to make the symptoms better.  She has not taken any additional pain medicine.  She denies wheezing, difficulty breathing, nausea or vomiting.  She reports that the welts have resolved however the itching persists.  Patient reports a history of allergies to sulfa drugs but no other known allergies.      The history is provided by the patient and medical records. No language interpreter was used.    Past Medical History:  Diagnosis Date  . Asthma   . Obesity     Patient Active Problem List   Diagnosis Date Noted  . Chronic right-sided low back pain 03/08/2019  . Anxiety 09/21/2018  . Stress at home 09/21/2018  . Eczema 09/21/2018  . Arthritis 09/21/2018    Past Surgical History:  Procedure Laterality Date  . LAPAROSCOPIC GASTRIC BANDING     2009     OB History   No obstetric history on file.      Home Medications    Prior to Admission medications   Medication  Sig Start Date End Date Taking? Authorizing Provider  cetirizine (ZYRTEC ALLERGY) 10 MG tablet Take 1 tablet (10 mg total) by mouth 2 (two) times daily for 5 days. 08/12/19 08/17/19  Siyon Linck, Jarrett Soho, PA-C  famotidine (PEPCID) 20 MG tablet Take 1 tablet (20 mg total) by mouth 2 (two) times daily. 08/12/19   Neida Ellegood, Jarrett Soho, PA-C  meloxicam (MOBIC) 15 MG tablet TAKE 1 TABLET (15 MG TOTAL) BY MOUTH DAILY. TAKE WITH MEALS 04/28/19   Scot Jun, FNP  montelukast (SINGULAIR) 10 MG tablet Take 1 tablet (10 mg total) by mouth at bedtime. 01/17/19   Scot Jun, FNP  Multiple Vitamin (MULTIVITAMIN WITH MINERALS) TABS tablet Take 1 tablet by mouth daily.    [provider]  omeprazole (PRILOSEC) 40 MG capsule Take 1 capsule (40 mg total) by mouth daily. 06/25/19   Scot Jun, FNP  oxybutynin (DITROPAN) 5 MG tablet Take 1 tablet (5 mg total) by mouth 3 (three) times daily. Patient not taking: Reported on 06/25/2019 02/14/19   Scot Jun, FNP  predniSONE (DELTASONE) 20 MG tablet Take 2 tablets (40 mg total) by mouth daily. 08/12/19   Antoneo Ghrist, Jarrett Soho, PA-C  sertraline (ZOLOFT) 100 MG tablet Take 100 mg by mouth daily. 05/09/19   [provider]  triamcinolone ointment (KENALOG) 0.5 % Apply 1 application topically 2 (two) times daily. 06/25/19   Scot Jun, FNP  Family History Family History  Family history unknown: Yes    Social History Social History   Tobacco Use  . Smoking status: Never Smoker  . Smokeless tobacco: Never Used  Substance Use Topics  . Alcohol use: Yes    Comment: occ  . Drug use: No     Allergies   Metformin and related and Sulfa antibiotics   Review of Systems Review of Systems  Constitutional: Negative for appetite change, diaphoresis, fatigue, fever and unexpected weight change.  HENT: Negative for mouth sores.   Eyes: Negative for visual disturbance.  Respiratory: Negative for cough, chest tightness,  shortness of breath and wheezing.   Cardiovascular: Negative for chest pain.  Gastrointestinal: Negative for abdominal pain, constipation, diarrhea, nausea and vomiting.  Skin: Positive for rash.  Allergic/Immunologic: Negative for immunocompromised state.  Neurological: Negative for syncope, light-headedness and headaches.     Physical Exam Updated Vital Signs BP (!) 152/88 (BP Location: Right Arm)   Pulse 89   Temp 98.2 F (36.8 C) (Oral)   Resp 16   LMP 02/09/2014   SpO2 100%   Physical Exam Vitals signs and nursing note reviewed.  Constitutional:      General: She is not in acute distress.    Appearance: She is well-developed.     Comments: Uncomfortable appearing, constantly scratching.  HENT:     Head: Normocephalic.  Eyes:     General: No scleral icterus.    Conjunctiva/sclera: Conjunctivae normal.  Neck:     Musculoskeletal: Normal range of motion.  Cardiovascular:     Rate and Rhythm: Normal rate.  Pulmonary:     Effort: Pulmonary effort is normal. No tachypnea, accessory muscle usage or respiratory distress.  Abdominal:     General: There is no distension.  Musculoskeletal: Normal range of motion.  Skin:    General: Skin is warm and dry.     Comments: Patient is covered in calamine lotion.  No visible rash.  Excoriations over the arms, legs, chest, neck and back.   No open wounds.  Neurological:     Mental Status: She is alert.  Psychiatric:        Mood and Affect: Mood normal.      ED Treatments / Results   Procedures Procedures (including critical care time)  Medications Ordered in ED Medications  diphenhydrAMINE (BENADRYL) capsule 25 mg (25 mg Oral Given 08/12/19 0319)  predniSONE (DELTASONE) tablet 60 mg (60 mg Oral Given 08/12/19 0319)  famotidine (PEPCID) tablet 20 mg (20 mg Oral Given 08/12/19 0319)     Initial Impression / Assessment and Plan / ED Course  I have reviewed the triage vital signs and the nursing notes.  Pertinent labs &  imaging results that were available during my care of the patient were reviewed by me and considered in my medical decision making (see chart for details).        Patient presents with allergic reaction.  No signs of anaphylaxis.  It is been approximately 30 hours since she took the medication which caused the reaction.  Will give Benadryl, prednisone and Pepcid here in the emergency department.  Patient will be discharged home with a 5-day burst of these medications.  Discussed close monitoring of her reaction and reasons to return immediately to the emergency department.  Patient states understanding and is in agreement with the plan.  Final Clinical Impressions(s) / ED Diagnoses   Final diagnoses:  Allergic reaction, initial encounter  Itching    ED Discharge Orders  Ordered    cetirizine (ZYRTEC ALLERGY) 10 MG tablet  2 times daily     08/12/19 0319    predniSONE (DELTASONE) 20 MG tablet  Daily     08/12/19 0319    famotidine (PEPCID) 20 MG tablet  2 times daily     08/12/19 0319           Janina Trafton, Boyd Kerbs 08/12/19 0325    Gilda Crease, MD 08/12/19 [phone]

## 2019-08-12 NOTE — ED Triage Notes (Signed)
Pt reports that she has been itchy since last night when she took an OTC pain reliever at home. States that the only area that does not itch is her head and face. Pt has tried calamine lotion.

## 2019-08-12 NOTE — Discharge Instructions (Signed)
1. Medications: Prednisone, Zyrtec, Pepcid, usual home medications 2. Treatment: rest, drink plenty of fluids, take medications as prescribed 3. Follow Up: Please followup with your primary doctor in 2-3 days if no improvement; Return to the ER for difficulty breathing, return of allergic reaction or other concerning symptoms

## 2019-08-13 ENCOUNTER — Telehealth: Payer: Self-pay

## 2019-08-13 NOTE — Telephone Encounter (Signed)

## 2019-08-14 ENCOUNTER — Ambulatory Visit (INDEPENDENT_AMBULATORY_CARE_PROVIDER_SITE_OTHER): Payer: Medicaid Other | Admitting: Nurse Practitioner

## 2019-08-14 ENCOUNTER — Other Ambulatory Visit: Payer: Self-pay

## 2019-08-14 VITALS — BP 118/78 | HR 78 | Temp 97.3°F | Resp 17 | Wt 279.8 lb

## 2019-08-14 DIAGNOSIS — F419 Anxiety disorder, unspecified: Secondary | ICD-10-CM | POA: Diagnosis not present

## 2019-08-14 DIAGNOSIS — M79605 Pain in left leg: Secondary | ICD-10-CM

## 2019-08-14 DIAGNOSIS — R7303 Prediabetes: Secondary | ICD-10-CM

## 2019-08-14 DIAGNOSIS — F329 Major depressive disorder, single episode, unspecified: Secondary | ICD-10-CM | POA: Diagnosis not present

## 2019-08-14 DIAGNOSIS — M79604 Pain in right leg: Secondary | ICD-10-CM

## 2019-08-14 DIAGNOSIS — J309 Allergic rhinitis, unspecified: Secondary | ICD-10-CM | POA: Diagnosis not present

## 2019-08-14 DIAGNOSIS — Z1211 Encounter for screening for malignant neoplasm of colon: Secondary | ICD-10-CM

## 2019-08-14 DIAGNOSIS — Z1159 Encounter for screening for other viral diseases: Secondary | ICD-10-CM

## 2019-08-14 DIAGNOSIS — Z1231 Encounter for screening mammogram for malignant neoplasm of breast: Secondary | ICD-10-CM

## 2019-08-14 MED ORDER — MONTELUKAST SODIUM 10 MG PO TABS: 10.0000 mg | ORAL_TABLET | Freq: Every day | ORAL | 0 refills | Status: DC

## 2019-08-14 NOTE — Progress Notes (Signed)
Assessment & Plan:  Monica Duarte was seen today for prediabetes, depression and anxiety.  Diagnoses and all orders for this visit:  Anxiety and depression -     Ambulatory referral to Psychiatry  Prediabetes -     Hemoglobin A1c -     Comprehensive metabolic panel  Need for hepatitis C screening test -     Hepatitis C Antibody  Colon cancer screening -     Ambulatory referral to Gastroenterology  Breast cancer screening by mammogram -     MM 3D SCREEN BREAST BILATERAL; Future  Allergic rhinitis, unspecified seasonality, unspecified trigger -     montelukast (SINGULAIR) 10 MG tablet; Take 1 tablet (10 mg total) by mouth at bedtime.  Bilateral leg pain -     POCT ABI Screening Pilot No Charge    Patient has been counseled on age-appropriate routine health concerns for screening and prevention. These are reviewed and up-to-date. Referrals have been placed accordingly. Immunizations are up-to-date or declined.    Subjective:   Chief Complaint  Patient presents with  . Prediabetes  . Depression  . Anxiety   HPI Monica Duarte 63 y.o. female presents to office today for follow-up to anxiety and depression.  [facility] mother with alzheimer disease. 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 She endorses increased irritability and frustrated. She is unable to sleep duefears related toher mother wandering throughout the nightaround the house.She has 3 siiblings who are uninvolved in her mother's care. She has been seen previously by her counselor here in office [name] who recommended respite care. At the time she was not open to the suggestion however she is now open. Doesn't feel she needs any additional medications aside from current zoloft. Denies self harm or thoughts of harming others. Today patient reports she visited a few centers for her mother and did not like the environment  so she was unable to find any appropriate centers for day placement.  She has not been taking her Zoloft every day as prescribed and reports that she has been drinking alcohol daily.  She denies any current thoughts of self-harm but does endorse ruminating thoughts with no specific plan. Will refer to psychiatry today.  I have instructed her that alcohol is a depressant and will actually increase her mood lability and that she should refrain from all alcohol use and restart her Zofran as prescribed.  She verbalized understanding and agrees that she will restart her Zoloft 100 mg daily.  Other symptoms associated with anxiety and depression include: Insomnia and increased short-term memory loss.  I have recommended that she try over-the-counter melatonin at this time for insomnia. [facility] 08/14/2019 06/25/2019 02/26/2019 09/20/2018  Decreased Interest 3 2 2 1   Down, Depressed, Hopeless 3 2 3 1   PHQ - 2 Score 6 4 5 2   Altered sleeping 3 2 3 2   Tired, decreased energy 3 2 2 2   Change in appetite 3 2 3 3   Feeling bad or failure about yourself  3 2 3 2   Trouble concentrating 3 2 3 3   Moving slowly or fidgety/restless 0 0 0 0  Suicidal thoughts 1 0 0 0  PHQ-9 Score 22 14 19 14    GAD 7 : Generalized Anxiety Score 08/14/2019 06/25/2019 02/26/2019 09/20/2018  Nervous, Anxious, on Edge 3 2 2 3   Control/stop worrying 3 2 2 3   Worry too much - different things  2 2 2 3   Trouble relaxing 2 2 3 2   Restless 0 0 0 1  Easily annoyed or irritable 3 2 3 2   Afraid - awful might happen 2 1 1 2   Total GAD 7 Score 15 11 13 16       Review of Systems  Constitutional: Negative for fever, malaise/fatigue and weight loss.  HENT: Negative.  Negative for nosebleeds.   Eyes: Negative.  Negative for blurred vision, double vision and photophobia.  Respiratory: Negative.  Negative for cough and shortness of breath.   Cardiovascular: Positive for claudication and leg swelling. Negative for chest pain and  palpitations.       She endorses leg pain and swelling and states she was told many years ago that she had blockages in her legs.  Was supposed to have seen however could not afford co-pay at that time.  Gastrointestinal: Negative.  Negative for heartburn, nausea and vomiting.  Musculoskeletal: Negative.  Negative for myalgias.  Neurological: Negative.  Negative for dizziness, focal weakness, seizures and headaches.  Endo/Heme/Allergies: Positive for environmental allergies.  Psychiatric/Behavioral: Positive for depression and memory loss. Negative for suicidal ideas. The patient is nervous/anxious and has insomnia.     Past Medical History:  Diagnosis Date  . Asthma   . Obesity     Past Surgical History:  Procedure Laterality Date  . LAPAROSCOPIC GASTRIC BANDING     2009    Family History  Family history unknown: Yes    Social History Reviewed with no changes to be made today.   Outpatient Medications Prior to Visit  Medication Sig Dispense Refill  . cetirizine (ZYRTEC ALLERGY) 10 MG tablet Take 1 tablet (10 mg total) by mouth 2 (two) times daily for 5 days. 10 tablet 0  . meloxicam (MOBIC) 15 MG tablet TAKE 1 TABLET (15 MG TOTAL) BY MOUTH DAILY. TAKE WITH MEALS 30 tablet 2  . Multiple Vitamin (MULTIVITAMIN WITH MINERALS) TABS tablet Take 1 tablet by mouth daily.    Marland Kitchen omeprazole (PRILOSEC) 40 MG capsule Take 1 capsule (40 mg total) by mouth daily. 30 capsule 3  . predniSONE (DELTASONE) 20 MG tablet Take 2 tablets (40 mg total) by mouth daily. 10 tablet 0  . sertraline (ZOLOFT) 100 MG tablet Take 100 mg by mouth daily.    Marland Kitchen triamcinolone ointment (KENALOG) 0.5 % Apply 1 application topically 2 (two) times daily. 90 g 3  . famotidine (PEPCID) 20 MG tablet Take 1 tablet (20 mg total) by mouth 2 (two) times daily. 10 tablet 0  . montelukast (SINGULAIR) 10 MG tablet Take 1 tablet (10 mg total) by mouth at bedtime. 30 tablet 3  . oxybutynin (DITROPAN) 5 MG tablet Take 1 tablet (5 mg  total) by mouth 3 (three) times daily. (Patient not taking: Reported on 06/25/2019) 90 tablet 3   No facility-administered medications prior to visit.     Allergies  Allergen Reactions  . Metformin And Related Other (See Comments)    Mouth soreness  . Sulfa Antibiotics Other (See Comments)    Gives pt mouth sores       Objective:    BP 118/78   Pulse 78   Temp (!) 97.3 F (36.3 C) (Temporal)   Resp 17   Wt 279 lb 12.8 oz (126.9 kg)   LMP 02/09/2014   SpO2 95%   BMI 46.56 kg/m  Wt Readings from Last 3 Encounters:  08/14/19 279 lb 12.8 oz (126.9 kg)  05/25/19 260 lb (117.9 kg)  03/08/19 280 lb (127 kg)    Physical Exam Vitals signs and nursing note reviewed.  Constitutional:      Appearance: She is well-developed.  HENT:     Head: Normocephalic and atraumatic.  Neck:     Musculoskeletal: Normal range of motion.  Cardiovascular:     Rate and Rhythm: Normal rate and regular rhythm.     Heart sounds: Normal heart sounds. No murmur. No friction rub. No gallop.   Pulmonary:     Effort: Pulmonary effort is normal. No tachypnea or respiratory distress.     Breath sounds: Normal breath sounds. No decreased breath sounds, wheezing, rhonchi or rales.  Chest:     Chest wall: No tenderness.  Abdominal:     General: Bowel sounds are normal.     Palpations: Abdomen is soft.  Musculoskeletal: Normal range of motion.  Skin:    General: Skin is warm and dry.  Neurological:     Mental Status: She is alert and oriented to person, place, and time.     Coordination: Coordination normal.  Psychiatric:        Attention and Perception: Attention normal.        Mood and Affect: Mood and affect normal.        Speech: Speech normal.        Behavior: Behavior normal. Behavior is cooperative.        Thought Content: Thought content normal.        Cognition and Memory: Cognition normal.        Judgment: Judgment normal.        Patient has been counseled extensively about nutrition  and exercise as well as the importance of adherence with medications and regular follow-up. The patient was given clear instructions to go to ER or return to medical center if symptoms don't improve, worsen or new problems develop. The patient verbalized understanding.   Follow-up: Return if symptoms worsen or fail to improve, for schedule ABI with [facility] .   Claiborne Rigg, FNP-BC [facility] [place], [place] [phone]   08/14/2019, 5:12 PM

## 2019-08-15 LAB — COMPREHENSIVE METABOLIC PANEL
ALT: 20 IU/L (ref 0–32)
AST: 13 IU/L (ref 0–40)
Albumin/Globulin Ratio: 1.5 (ref 1.2–2.2)
Albumin: 4.3 g/dL (ref 3.8–4.9)
Alkaline Phosphatase: 87 IU/L (ref 39–117)
BUN/Creatinine Ratio: 13 (ref 12–28)
BUN: 10 mg/dL (ref 8–27)
Bilirubin Total: <0.2 mg/dL (ref 0.0–1.2)
CO2: 22 mmol/L (ref 20–29)
Calcium: 9.6 mg/dL (ref 8.7–10.3)
Chloride: 104 mmol/L (ref 96–106)
Creatinine, Ser: 0.77 mg/dL (ref 0.57–1.00)
GFR calc Af Amer: 97 mL/min/{1.73_m2} (ref >59)
GFR calc non Af Amer: 84 mL/min/{1.73_m2} (ref >59)
Globulin, Total: 2.9 g/dL (ref 1.5–4.5)
Glucose: 96 mg/dL (ref 65–99)
Potassium: 3.9 mmol/L (ref 3.5–5.2)
Sodium: 140 mmol/L (ref 134–144)
Total Protein: 7.2 g/dL (ref 6.0–8.5)

## 2019-08-15 LAB — HEMOGLOBIN A1C
Est. average glucose Bld gHb Est-mCnc: 131 mg/dL
Hgb A1c MFr Bld: 6.2 % — ABNORMAL HIGH (ref 4.8–5.6)

## 2019-08-15 LAB — HEPATITIS C ANTIBODY: Hep C Virus Ab: <0.1 s/co ratio (ref 0.0–0.9)

## 2019-08-20 ENCOUNTER — Other Ambulatory Visit: Payer: Self-pay

## 2019-08-20 ENCOUNTER — Ambulatory Visit: Payer: Medicaid Other | Attending: Family Medicine

## 2019-08-20 DIAGNOSIS — M79604 Pain in right leg: Secondary | ICD-10-CM

## 2019-08-20 DIAGNOSIS — M79605 Pain in left leg: Secondary | ICD-10-CM

## 2019-08-20 LAB — POCT ABI - SCREENING FOR PILOT NO CHARGE
Left ABI: 1.08
Right ABI: 1.15

## 2019-08-20 NOTE — Progress Notes (Signed)
Patient notified of results & recommendations. Expressed understanding.

## 2019-08-20 NOTE — Progress Notes (Signed)
Patient was here for ABI POCT.  Pt. Tolerated well.

## 2019-08-23 ENCOUNTER — Ambulatory Visit
Admission: EM | Admit: 2019-08-23 | Discharge: 2019-08-23 | Disposition: A | Discharge status: Home / Self Care | Payer: Medicaid Other

## 2019-08-23 ENCOUNTER — Encounter: Payer: Self-pay | Admitting: Emergency Medicine

## 2019-08-23 ENCOUNTER — Other Ambulatory Visit: Payer: Self-pay

## 2019-08-23 DIAGNOSIS — T7840XA Allergy, unspecified, initial encounter: Secondary | ICD-10-CM | POA: Diagnosis not present

## 2019-08-23 MED ORDER — PREDNISONE 10 MG (21) PO TBPK: ORAL_TABLET | Freq: Every day | ORAL | 0 refills | Status: DC

## 2019-08-23 NOTE — ED Provider Notes (Signed)
[facility]    CSN: [id number] Arrival date & time: 08/23/19  0901      History   Chief Complaint Chief Complaint  Patient presents with  . Pruritis    HPI Monica Duarte is a 63 y.o. female history of asthma, obesity presenting for whole body pruritus since last night.  Patient states that she took a dose of Aleve (unsure if generic versus brand) and developed symptoms within 2 hours of consumption.  Patient tried taking Benadryl with minimal relief.  Denies difficulty breathing, chest pain.  Patient was seen for similar concern on 8/31 in the ER setting: Records reviewed by me at time of appointment.  Patient treated for allergic reaction second to OTC pain reliever: Discharged with steroid taper, famotidine, Benadryl.  Patient states that these medications nearly resolved her symptoms last time, though she is unsure if she has been taking a medication that could be contributing to her symptoms.  Patient is followed by primary care, has never consulted allergy/immunology.  Past Medical History:  Diagnosis Date  . Asthma   . Obesity     Patient Active Problem List   Diagnosis Date Noted  . Chronic right-sided low back pain 03/08/2019  . Anxiety 09/21/2018  . Stress at home 09/21/2018  . Eczema 09/21/2018  . Arthritis 09/21/2018    Past Surgical History:  Procedure Laterality Date  . ectopic    . LAPAROSCOPIC GASTRIC BANDING     2009    OB History   No obstetric history on file.      Home Medications    Prior to Admission medications   Medication Sig Start Date End Date Taking? Authorizing Provider  Biotin 10 MG CAPS Take by mouth.   Yes [provider]  cetirizine (ZYRTEC ALLERGY) 10 MG tablet Take 1 tablet (10 mg total) by mouth 2 (two) times daily for 5 days. 08/12/19 08/17/19  Muthersbaugh, Dahlia Client, PA-C  meloxicam (MOBIC) 15 MG tablet TAKE 1 TABLET (15 MG TOTAL) BY MOUTH DAILY. TAKE WITH MEALS 04/28/19   Bing Neighbors, FNP   montelukast (SINGULAIR) 10 MG tablet Take 1 tablet (10 mg total) by mouth at bedtime. 08/14/19 11/12/19  Claiborne Rigg, NP  Multiple Vitamin (MULTIVITAMIN WITH MINERALS) TABS tablet Take 1 tablet by mouth daily.    [provider]  omeprazole (PRILOSEC) 40 MG capsule Take 1 capsule (40 mg total) by mouth daily. 06/25/19   Bing Neighbors, FNP  predniSONE (STERAPRED UNI-PAK 21 TAB) 10 MG (21) TBPK tablet Take by mouth daily. Take steroid taper as written 08/23/19   Hall-Potvin, [place], PA-C  sertraline (ZOLOFT) 100 MG tablet Take 100 mg by mouth daily. 05/09/19   [provider]  triamcinolone ointment (KENALOG) 0.5 % Apply 1 application topically 2 (two) times daily. 06/25/19   Bing Neighbors, FNP    Family History Family History  Problem Relation Age of Onset  . Hypertension Mother   . Diabetes Mother     Social History Social History   Tobacco Use  . Smoking status: Never Smoker  . Smokeless tobacco: Never Used  Substance Use Topics  . Alcohol use: Yes    Comment: wine and liquor, most days  . Drug use: No     Allergies   Metformin and related and Sulfa antibiotics   Review of Systems Review of Systems  Constitutional: Negative for fatigue and fever.  HENT: Negative for ear pain, sinus pain, sore throat and voice change.   Eyes:  Negative for pain, redness and visual disturbance.  Respiratory: Negative for cough and shortness of breath.   Cardiovascular: Negative for chest pain and palpitations.  Gastrointestinal: Negative for abdominal pain, diarrhea and vomiting.  Musculoskeletal: Negative for arthralgias and myalgias.  Skin: Negative for rash and wound.  Neurological: Negative for syncope and headaches.     Physical Exam Triage Vital Signs ED Triage Vitals  Enc Vitals Group     BP 08/23/19 0915 (!) 134/59     Pulse Rate 08/23/19 0915 69     Resp 08/23/19 0915 18     Temp 08/23/19 0915 98.1 F (36.7 C)     Temp Source 08/23/19 0915  Oral     SpO2 08/23/19 0915 95 %     Weight --      Height --      Head Circumference --      Peak Flow --      Pain Score 08/23/19 0916 0     Pain Loc --      Pain Edu? --      Excl. in GC? --    No data found.  Updated Vital Signs BP (!) 134/59 (BP Location: Right Arm)   Pulse 69   Temp 98.1 F (36.7 C) (Oral)   Resp 18   LMP 02/09/2014   SpO2 95%   Visual Acuity Right Eye Distance:   Left Eye Distance:   Bilateral Distance:    Right Eye Near:   Left Eye Near:    Bilateral Near:     Physical Exam Constitutional:      General: She is not in acute distress. HENT:     Head: Normocephalic and atraumatic.     Mouth/Throat:     Mouth: Mucous membranes are moist.     Pharynx: Oropharynx is clear.     Comments: Uvula is nonedematous, midline Eyes:     General: No scleral icterus.    Pupils: Pupils are equal, round, and reactive to light.  Neck:     Musculoskeletal: Normal range of motion and neck supple. No muscular tenderness.     Comments: Trachea midline Cardiovascular:     Rate and Rhythm: Normal rate and regular rhythm.     Heart sounds: No murmur. No gallop.   Pulmonary:     Effort: Pulmonary effort is normal. No respiratory distress.     Breath sounds: No wheezing, rhonchi or rales.  Lymphadenopathy:     Cervical: No cervical adenopathy.  Skin:    General: Skin is warm.     Capillary Refill: Capillary refill takes less than 2 seconds.     Coloration: Skin is not jaundiced or pale.     Findings: No erythema or rash.  Neurological:     General: No focal deficit present.     Mental Status: She is alert and oriented to person, place, and time.      UC Treatments / Results  Labs (all labs ordered are listed, but only abnormal results are displayed) Labs Reviewed - No data to display  EKG   Radiology No results found.  Procedures Procedures (including critical care time)  Medications Ordered in UC Medications - No data to display  Initial  Impression / Assessment and Plan / UC Course  I have reviewed the triage vital signs and the nursing notes.  Pertinent labs & imaging results that were available during my care of the patient were reviewed by me and considered in my medical decision making (see  chart for details).     1.  Allergic reaction Initiate steroid taper pack as outlined below.  Patient has been taking multiple OTC medications for chronic analgesia.  Discussed the importance of stopping all OTC medications, consulting with allergy/immunology to prevent recurrent reactions and prednisone exposure.  Return precautions discussed, patient verbalized understanding and is agreeable to plan. Final Clinical Impressions(s) / UC Diagnoses   Final diagnoses:  Allergic reaction, initial encounter     Discharge Instructions     Important to take steroid taper as prescribed with breakfast: Day 1-6 tabs, day 2-5 tabs, day 3-4 tabs, day 4-3 tabs, day 5-2 tabs, day 6-1 tab. Go to ER for worsening itching, fever, lip/tongue/throat swelling, difficulty breathing, chest pain.    ED Prescriptions    Medication Sig Dispense Auth. Provider   predniSONE (STERAPRED UNI-PAK 21 TAB) 10 MG (21) TBPK tablet Take by mouth daily. Take steroid taper as written 21 tablet Hall-Potvin, [place], PA-C     Controlled Substance Prescriptions [place] Controlled Substance Registry consulted? Not Applicable   Quincy Sheehan, [place] 08/23/19 [id number]

## 2019-08-23 NOTE — ED Notes (Signed)
Patient able to ambulate independently  

## 2019-08-23 NOTE — ED Triage Notes (Signed)
Pt presents to [facility] for assessment after having itching/allergic reaction after taking some sort of OTC pain medication 3 weeks ago, which she presented to the [facility] ER and was given prednisone.  States it was reduced, but never fully relieved.  Patient states she took another dose of Aleve last night and began having itching under her arms, chest, back, and head.  States she feels some bumps forming.  C/o itching to her private area.

## 2019-08-23 NOTE — Discharge Instructions (Addendum)
Important to take steroid taper as prescribed with breakfast: Day 1-6 tabs, day 2-5 tabs, day 3-4 tabs, day 4-3 tabs, day 5-2 tabs, day 6-1 tab. Go to ER for worsening itching, fever, lip/tongue/throat swelling, difficulty breathing, chest pain.

## 2019-09-09 ENCOUNTER — Telehealth: Payer: Self-pay | Admitting: Family Medicine

## 2019-09-09 NOTE — Telephone Encounter (Signed)
Patient is requesting a prescription for pain for arthritis.

## 2019-09-10 ENCOUNTER — Other Ambulatory Visit: Payer: Self-pay | Admitting: Nurse Practitioner

## 2019-09-10 MED ORDER — DICLOFENAC SODIUM 1 % TD GEL: 2.0000 g | Freq: Four times a day (QID) | TRANSDERMAL | 3 refills | Status: AC

## 2019-09-10 NOTE — Telephone Encounter (Signed)
Medication has been sent to the pharmacy. 

## 2019-10-03 ENCOUNTER — Ambulatory Visit: Payer: Medicaid Other

## 2019-10-29 MED FILL — MELOXICAM 15 MG TABLET: 15 | 30 days supply | Qty: 30 | Fill #2

## 2019-12-27 MED FILL — TRIAMCINOLONE 0.5% OINTMENT: 0.5 | 15 days supply | Qty: 30 | Fill #2

## 2019-12-27 MED FILL — FAMOTIDINE 20 MG TABS: 20 | 5 days supply | Qty: 10 | Fill #0

## 2019-12-27 MED FILL — OXYBUTYNIN 5 MG TABLET: 5 | 30 days supply | Qty: 90 | Fill #1

## 2019-12-27 MED FILL — MELOXICAM 15 MG TABLET: 15 | 30 days supply | Qty: 30 | Fill #2

## 2019-12-27 MED FILL — MONTELUKAST SOD 10 MG TAB: 10 | 30 days supply | Qty: 30 | Fill #3

## 2020-03-31 IMAGING — CR DG HIP (WITH OR WITHOUT PELVIS) 2-3V*R*
3 series · 3 of 3 positions shown · non-contrast
Comparison: None.

CLINICAL DATA: Right-sided hip pain.

EXAM:
DG HIP (WITH OR WITHOUT PELVIS) 2-3V RIGHT

[t pelvis ap]
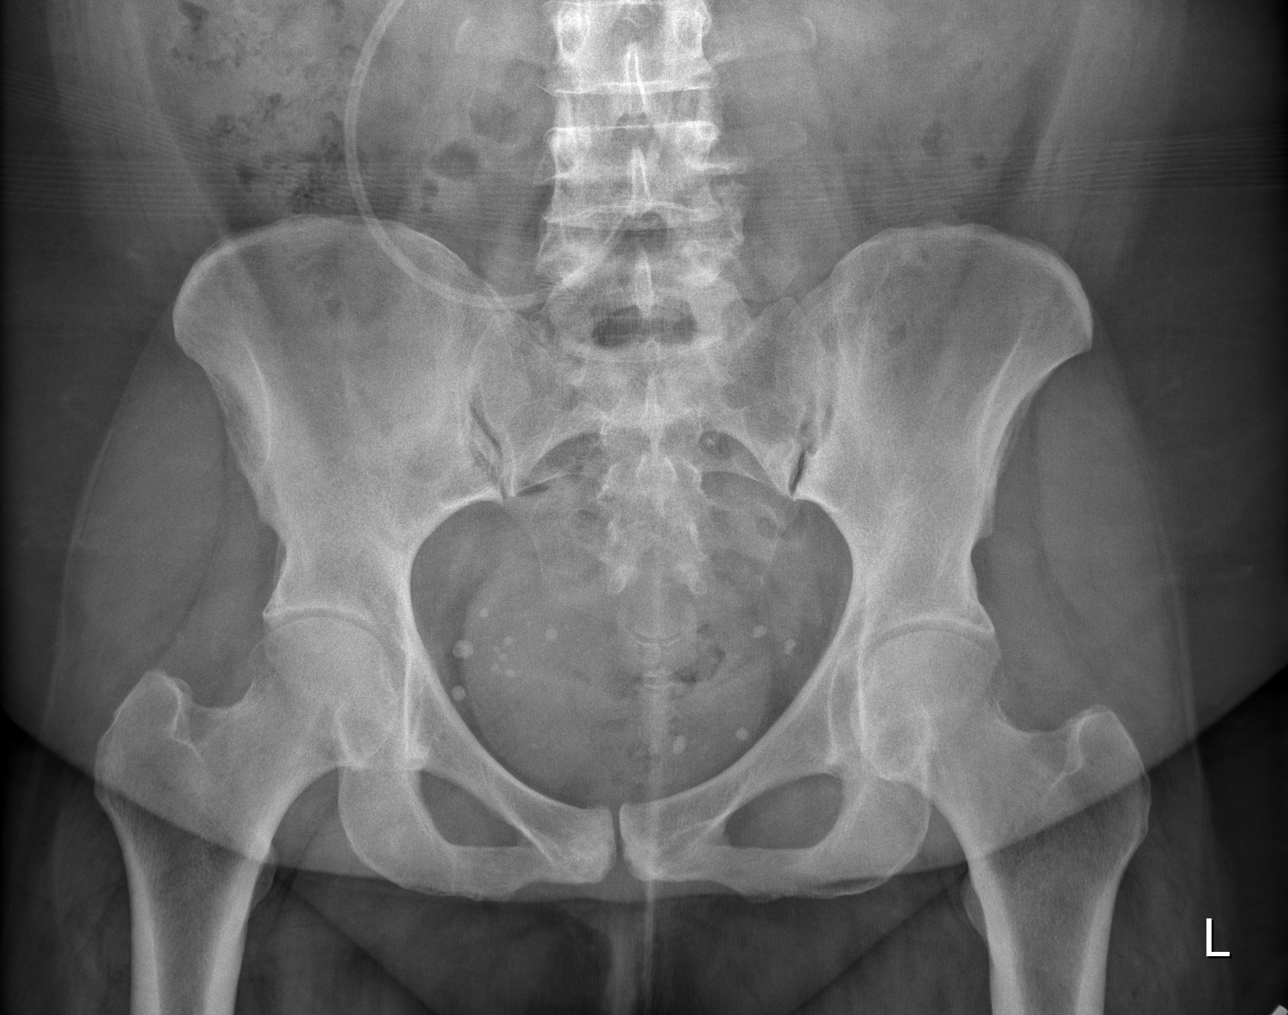

[t hip ap right]
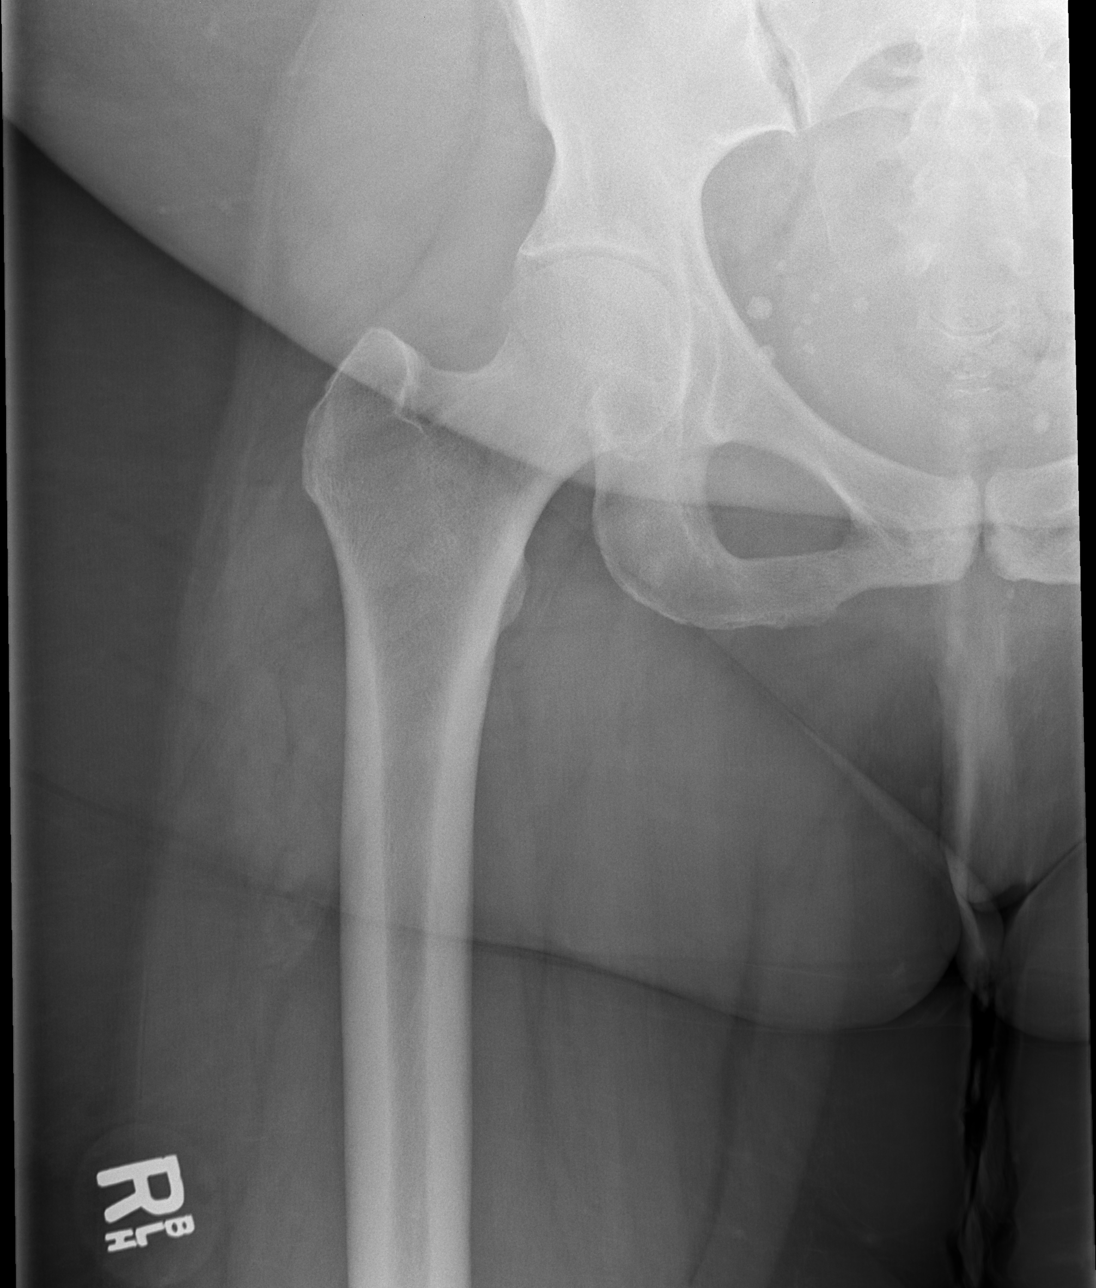

[t hip frog leg right]
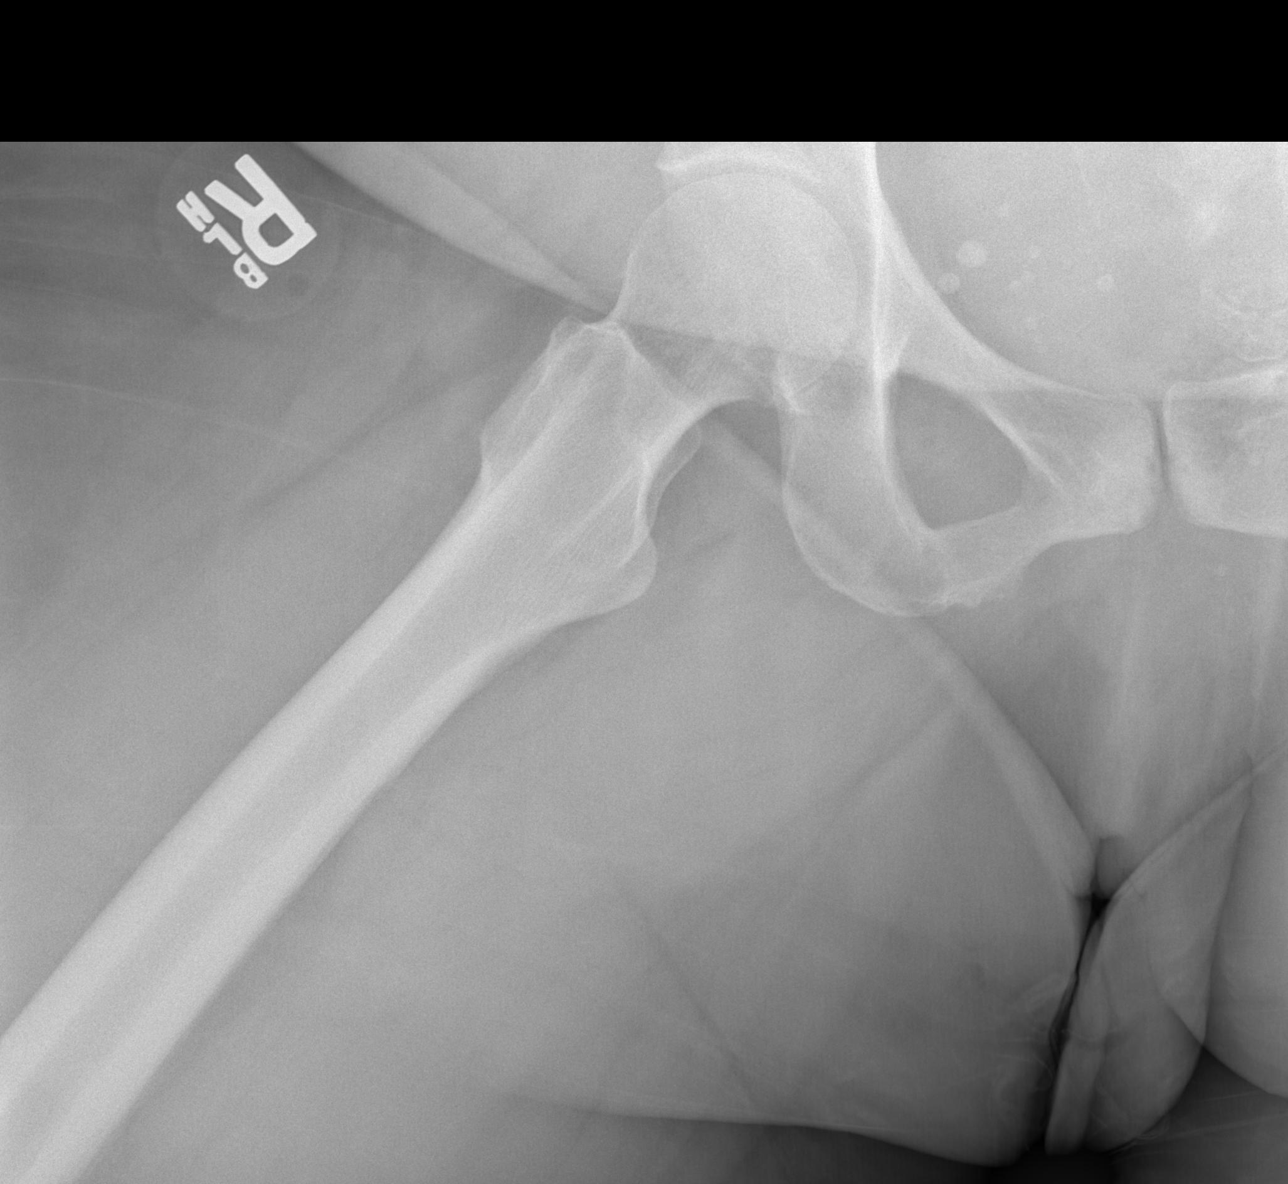

[3 of 3 positions shown; findings below may reference images not displayed]

FINDINGS: There is no evidence of hip fracture or dislocation. There is no
evidence of arthropathy or other focal bone abnormality.
IMPRESSION: Negative.

## 2020-04-26 ENCOUNTER — Encounter (HOSPITAL_COMMUNITY): Payer: Self-pay

## 2020-04-26 ENCOUNTER — Ambulatory Visit (HOSPITAL_COMMUNITY)
Admission: EM | Admit: 2020-04-26 | Discharge: 2020-04-26 | Disposition: A | Discharge status: Home / Self Care | Payer: Medicaid Other | Attending: Emergency Medicine | Admitting: Emergency Medicine

## 2020-04-26 ENCOUNTER — Other Ambulatory Visit: Payer: Self-pay

## 2020-04-26 DIAGNOSIS — M25511 Pain in right shoulder: Secondary | ICD-10-CM

## 2020-04-26 DIAGNOSIS — M7541 Impingement syndrome of right shoulder: Secondary | ICD-10-CM

## 2020-04-26 MED ORDER — NAPROXEN 500 MG PO TABS: 500.0000 mg | ORAL_TABLET | Freq: Two times a day (BID) | ORAL | 0 refills | Status: DC

## 2020-04-26 MED ORDER — PREDNISONE 20 MG PO TABS: 40.0000 mg | ORAL_TABLET | Freq: Every day | ORAL | 0 refills | Status: AC

## 2020-04-26 NOTE — ED Provider Notes (Signed)
HPI  SUBJECTIVE:  Monica Duarte is a right-handed 64 y.o. female who presents with 4 weeks of "deep" shoulder pain located primarily in the deltoid region that is present only with use.  She describes the pain as "heavy".  No change in physical activity, denies repetitive motions with the shoulder.  No trauma.  No erythema, swelling, distal numbness or tingling, grip weakness, chest pain, shortness of breath.  She has tried Tylenol arthritis without improvement in symptoms.  Symptoms are better with rest.  Symptoms are worse with reaching behind her, lying down on her right side.  States it is waking up her night when she rolls over onto her right shoulder.  She has a past medical history of gastric lap band, asthma, osteoarthritis.  No history of right shoulder injury, diabetes, hypertension.  [facility]: [facility]   Past Medical History:  Diagnosis Date  . Asthma   . Obesity     Past Surgical History:  Procedure Laterality Date  . ectopic    . LAPAROSCOPIC GASTRIC BANDING     2009    Family History  Problem Relation Age of Onset  . Hypertension Mother   . Diabetes Mother     Social History   Tobacco Use  . Smoking status: Never Smoker  . Smokeless tobacco: Never Used  Substance Use Topics  . Alcohol use: Yes    Comment: wine and liquor, most days  . Drug use: No    No current facility-administered medications for this encounter.  Current Outpatient Medications:  .  Biotin 10 MG CAPS, Take by mouth., Disp: , Rfl:  .  cetirizine (ZYRTEC ALLERGY) 10 MG tablet, Take 1 tablet (10 mg total) by mouth 2 (two) times daily for 5 days., Disp: 10 tablet, Rfl: 0 .  montelukast (SINGULAIR) 10 MG tablet, Take 1 tablet (10 mg total) by mouth at bedtime., Disp: 90 tablet, Rfl: 0 .  Multiple Vitamin (MULTIVITAMIN WITH MINERALS) TABS tablet, Take 1 tablet by mouth daily., Disp: , Rfl:  .  naproxen (NAPROSYN) 500 MG tablet, Take 1 tablet (500 mg total) by mouth 2 (two) times  daily., Disp: 20 tablet, Rfl: 0 .  omeprazole (PRILOSEC) 40 MG capsule, Take 1 capsule (40 mg total) by mouth daily., Disp: 30 capsule, Rfl: 3 .  predniSONE (DELTASONE) 20 MG tablet, Take 2 tablets (40 mg total) by mouth daily with breakfast for 5 days., Disp: 10 tablet, Rfl: 0 .  sertraline (ZOLOFT) 100 MG tablet, Take 100 mg by mouth daily., Disp: , Rfl:  .  triamcinolone ointment (KENALOG) 0.5 %, Apply 1 application topically 2 (two) times daily., Disp: 90 g, Rfl: 3  Allergies  Allergen Reactions  . Metformin And Related Other (See Comments)    Mouth soreness  . Sulfa Antibiotics Other (See Comments)    Gives pt mouth sores     ROS  As noted in HPI.   Physical Exam  BP 128/86 (BP Location: Left Arm)   Pulse 97   Temp 98.3 F (36.8 C) (Oral)   LMP 02/09/2014   SpO2 99%   Constitutional: Well developed, well nourished, no acute distress Eyes:  EOMI, conjunctiva normal bilaterally HENT: Normocephalic, atraumatic,mucus membranes moist Respiratory: Normal inspiratory effort Cardiovascular: Normal rate GI: nondistended skin: No rash, skin intact Musculoskeletal: Shoulder symmetric.  No erythema, bruising, swelling.  R shoulder with ROM somewhat limited , Drop test painful but negative, cannot AB duct past 90 degrees, clavicle NT , A/C joint NT, scapula NT ,  proximal humerus NT , shoulder joint NT , Motor strength normal, Sensation intact LT over deltoid region, distal NVI with hand having grossly intact sensation and strength.  Grip 5/5 and equal bilaterally.   no pain with internal rotation,  no pain with external rotation,  negative tenderness in bicipital groove, positive empty can test,  positive liftoff test,no instability but pain significantly aggravated with abduction/external rotation. Neurologic: Alert & oriented x 3, no focal neuro deficits Psychiatric: Speech and behavior appropriate   ED Course   Medications - No data to display  No orders of the defined types  were placed in this encounter.   No results found for this or any previous visit (from the past 24 hour(s)). No results found.  ED Clinical Impression  1. Impingement syndrome of right shoulder   2. Acute pain of right shoulder      ED Assessment/Plan  Suspect bursitis versus impingement of the rotator cuff.  She is neurovascularly intact.  We talked about doing x-rays, but I feel that these would be low yield in the absence of trauma.  We decided to defer this today.  We will try prednisone 40 mg for 5 days, Naprosyn/Tylenol twice a day, may take an additional 1000 mg of Tylenol 2 more times a day, follow-up with Dr. Lyla Glassing, [place], Drs. Paulla Fore or Raeford Razor for further evaluation, management.  Discussed MDM, treatment plan, and plan for follow-up with patient.patient agrees with plan.   Meds ordered this encounter  Medications  . naproxen (NAPROSYN) 500 MG tablet    Sig: Take 1 tablet (500 mg total) by mouth 2 (two) times daily.    Dispense:  20 tablet    Refill:  0  . predniSONE (DELTASONE) 20 MG tablet    Sig: Take 2 tablets (40 mg total) by mouth daily with breakfast for 5 days.    Dispense:  10 tablet    Refill:  0    *This clinic note was created using [employer]. Therefore, there may be occasional mistakes despite careful proofreading.   ?    Melynda Ripple, MD 04/26/20 [phone]

## 2020-04-26 NOTE — Discharge Instructions (Addendum)
I suspect you having an impingement syndrome, either bursitis or a problem with the rotator cuff.  Take the Naprosyn with 1000 mg of Tylenol twice a day.  You may take an additional 1000 mg of Tylenol 2 more times a day for total of 4000 mg of Tylenol.  Do not exceed 4000 mg of Tylenol from all sources in 1 day.  Finish the prednisone unless a provider tells you to stop.  Follow-up with orthopedics or sports medicine in a week if you are not feeling any better.

## 2020-04-26 NOTE — ED Triage Notes (Signed)
Pt present right shoulder pain, symptoms started 4 weeks ago. Pt thought symptoms would get better but they have not. Pt denies any injury to the right shoulder.

## 2020-07-03 ENCOUNTER — Ambulatory Visit: Payer: Medicaid Other | Admitting: Family Medicine

## 2020-07-06 ENCOUNTER — Ambulatory Visit (INDEPENDENT_AMBULATORY_CARE_PROVIDER_SITE_OTHER): Payer: Self-pay | Admitting: Family Medicine

## 2020-07-06 ENCOUNTER — Other Ambulatory Visit: Payer: Self-pay

## 2020-07-06 ENCOUNTER — Encounter: Payer: Self-pay | Admitting: Family Medicine

## 2020-07-06 VITALS — BP 114/54 | Ht 65.5 in | Wt 290.0 lb

## 2020-07-06 DIAGNOSIS — M25511 Pain in right shoulder: Secondary | ICD-10-CM

## 2020-07-06 MED ORDER — MELOXICAM 15 MG PO TABS: 15.0000 mg | ORAL_TABLET | Freq: Every day | ORAL | 1 refills | Status: DC

## 2020-07-06 NOTE — Progress Notes (Signed)
PCP: Patient, No Pcp Per  Subjective:   HPI: Patient is a 64 y.o. female here for right shoulder pain.  Patient reports for about 2 months she has had lateral right shoulder pain. No known injury. No tenderness with this. This does hurt more at nighttime and with reaching behind her, adjusting the mirror in her car. She also reports pain when carrying anything that has some weight with it like her teapot. She tried oral prednisone with some relief while she was on this. She has been using a topical cream and oral naproxen which helps some. She is not tried any rehab exercises for this.  Past Medical History:  Diagnosis Date  . Asthma   . Obesity     Current Outpatient Medications on File Prior to Visit  Medication Sig Dispense Refill  . Biotin 10 MG CAPS Take by mouth.    . cetirizine (ZYRTEC ALLERGY) 10 MG tablet Take 1 tablet (10 mg total) by mouth 2 (two) times daily for 5 days. 10 tablet 0  . montelukast (SINGULAIR) 10 MG tablet Take 1 tablet (10 mg total) by mouth at bedtime. 90 tablet 0  . Multiple Vitamin (MULTIVITAMIN WITH MINERALS) TABS tablet Take 1 tablet by mouth daily.    Marland Kitchen omeprazole (PRILOSEC) 40 MG capsule Take 1 capsule (40 mg total) by mouth daily. 30 capsule 3  . sertraline (ZOLOFT) 100 MG tablet Take 100 mg by mouth daily.    Marland Kitchen triamcinolone ointment (KENALOG) 0.5 % Apply 1 application topically 2 (two) times daily. 90 g 3   No current facility-administered medications on file prior to visit.    Past Surgical History:  Procedure Laterality Date  . ectopic    . LAPAROSCOPIC GASTRIC BANDING     2009    Allergies  Allergen Reactions  . Metformin And Related Other (See Comments)    Mouth soreness  . Sulfa Antibiotics Other (See Comments)    Gives pt mouth sores    Social History   Socioeconomic History  . Marital status: Divorced    Spouse name: Not on file  . Number of children: Not on file  . Years of education: Not on file  . Highest  education level: Not on file  Occupational History  . Not on file  Tobacco Use  . Smoking status: Never Smoker  . Smokeless tobacco: Never Used  Substance and Sexual Activity  . Alcohol use: Yes    Comment: wine and liquor, most days  . Drug use: No  . Sexual activity: Not on file  Other Topics Concern  . Not on file  Social History Narrative  . Not on file   Social Determinants of Health   Financial Resource Strain:   . Difficulty of Paying Living Expenses:   Food Insecurity:   . Worried About Programme researcher, broadcasting/film/video in the Last Year:   . Barista in the Last Year:   Transportation Needs:   . Freight forwarder (Medical):   Marland Kitchen Lack of Transportation (Non-Medical):   Physical Activity:   . Days of Exercise per Week:   . Minutes of Exercise per Session:   Stress:   . Feeling of Stress :   Social Connections:   . Frequency of Communication with Friends and Family:   . Frequency of Social Gatherings with Friends and Family:   . Attends Religious Services:   . Active Member of Clubs or Organizations:   . Attends Banker Meetings:   .  Marital Status:   Intimate Partner Violence:   . Fear of Current or Ex-Partner:   . Emotionally Abused:   Marland Kitchen Physically Abused:   . Sexually Abused:     Family History  Problem Relation Age of Onset  . Hypertension Mother   . Diabetes Mother     BP (!) 114/54   Ht 5' 5.5" (1.664 m)   Wt (!) 290 lb (131.5 kg)   LMP 02/09/2014   BMI 47.52 kg/m   Review of Systems: See HPI above.     Objective:  Physical Exam:  Gen: NAD, comfortable in exam room  Right shoulder: No gross deformity, swelling, bruising. No tenderness to palpation over [facility] joint, biceps tendon. Full passive range of motion.  Active range of motion is limited to 90 degrees with flexion and abduction but has full internal and external rotation. Strength 5 out of 5 with internal rotation, external rotation, empty can. Negative Hawkins.  Minimally  positive Neer's. Negative Yergason's. Neurovascular intact distally.   Assessment & Plan:  1.  Right shoulder pain: Secondary to rotator cuff impingement.  We discussed options and she would like to try home exercise program which was reviewed today.  Meloxicam daily with food for pain and inflammation.  Patient was instructed to stop the naproxen.  Tylenol if needed.  Consider imaging, subacromial injection, physical therapy, nitro patches if not improving.  Follow-up in 6 weeks.

## 2020-07-06 NOTE — Patient Instructions (Signed)
You have rotator cuff impingement Try to avoid painful activities (overhead activities, lifting with extended arm) as much as possible. Meloxicam 15 mg daily with food for pain and inflammation - stop the naproxen while you're on this. Can take tylenol in addition to this. Subacromial injection may be beneficial to help with pain and to decrease inflammation. Consider physical therapy with transition to home exercise program. Do home exercise program with theraband and scapular stabilization exercises daily 3 sets of 10 once a day. If not improving at follow-up we will consider further imaging, injection, physical therapy, and/or nitro patches. Follow up with me in 6 weeks.

## 2020-08-19 ENCOUNTER — Ambulatory Visit: Payer: Self-pay | Admitting: Family Medicine

## 2020-09-24 ENCOUNTER — Emergency Department (HOSPITAL_COMMUNITY)
Admission: EM | Admit: 2020-09-24 | Discharge: 2020-09-24 | Disposition: A | Discharge status: Home / Self Care | Payer: Medicaid Other | Attending: Emergency Medicine | Admitting: Emergency Medicine

## 2020-09-24 ENCOUNTER — Encounter (HOSPITAL_COMMUNITY): Payer: Self-pay | Admitting: *Deleted

## 2020-09-24 ENCOUNTER — Other Ambulatory Visit: Payer: Self-pay

## 2020-09-24 ENCOUNTER — Emergency Department (HOSPITAL_COMMUNITY): Payer: Medicaid Other

## 2020-09-24 DIAGNOSIS — J45909 Unspecified asthma, uncomplicated: Secondary | ICD-10-CM | POA: Insufficient documentation

## 2020-09-24 DIAGNOSIS — M25561 Pain in right knee: Secondary | ICD-10-CM | POA: Diagnosis present

## 2020-09-24 DIAGNOSIS — W19XXXA Unspecified fall, initial encounter: Secondary | ICD-10-CM | POA: Insufficient documentation

## 2020-09-24 MED ORDER — NAPROXEN 500 MG PO TABS
500.0000 mg | ORAL_TABLET | Freq: Once | ORAL | Status: AC
  Administered 2020-09-24: 500 mg via ORAL
  Filled 2020-09-24: qty 1

## 2020-09-24 MED ORDER — NAPROXEN 500 MG PO TABS: 500.0000 mg | ORAL_TABLET | Freq: Two times a day (BID) | ORAL | 0 refills | Status: DC

## 2020-09-24 NOTE — ED Notes (Signed)
An After Visit Summary was printed and given to the patient. Discharge instructions given and no further questions at this time.  

## 2020-09-24 NOTE — ED Triage Notes (Signed)
3 days of rt knee pain, pt had pain prior to fall 2 nights ago. Squeezing pain in area. Hurts more with lying down or walking

## 2020-09-24 NOTE — Discharge Instructions (Addendum)
You were evaluated in the Emergency Department and after careful evaluation, we did not find any emergent condition requiring admission or further testing in the hospital.  Your exam/testing today was overall reassuring.  Your x-ray did not show any broken bones.  It does show some arthritis, which we think is the cause of your discomfort.  We advise rest, Tylenol, and use of the Naprosyn medication provided.  Please return to the Emergency Department if you experience any worsening of your condition.  Thank you for allowing [place] to be a part of your care.

## 2020-09-24 NOTE — ED Provider Notes (Signed)
[facility] Emergency Department Provider Note MRN:  [id number]  Arrival date & time: 09/24/20     Chief Complaint   Knee Pain (r)   History of Present Illness   Monica Duarte is a 64 y.o. year-old female with a history of asthma, obesity presenting to the ED with chief complaint of knee pain.  1 to 2 weeks of right knee pain, constant, worse when bending the knee or walking.  Recently started a job where she has to walk around during the day.  No fever, fell 2 days ago but no head trauma.  Review of Systems  A problem-focused ROS was performed. Positive for knee pain.  Patient denies fever.  Patient's Health History    Past Medical History:  Diagnosis Date  . Asthma   . Obesity     Past Surgical History:  Procedure Laterality Date  . ectopic    . LAPAROSCOPIC GASTRIC BANDING     2009    Family History  Problem Relation Age of Onset  . Hypertension Mother   . Diabetes Mother     Social History   Socioeconomic History  . Marital status: Divorced    Spouse name: Not on file  . Number of children: Not on file  . Years of education: Not on file  . Highest education level: Not on file  Occupational History  . Not on file  Tobacco Use  . Smoking status: Never Smoker  . Smokeless tobacco: Never Used  Substance and Sexual Activity  . Alcohol use: Yes    Comment: wine and liquor, most days  . Drug use: No  . Sexual activity: Not on file  Other Topics Concern  . Not on file  Social History Narrative  . Not on file   Social Determinants of Health   Financial Resource Strain:   . Difficulty of Paying Living Expenses: Not on file  Food Insecurity:   . Worried About Programme researcher, broadcasting/film/video in the Last Year: Not on file  . Ran Out of Food in the Last Year: Not on file  Transportation Needs:   . Lack of Transportation (Medical): Not on file  . Lack of Transportation (Non-Medical): Not on file  Physical Activity:   . Days of Exercise  per Week: Not on file  . Minutes of Exercise per Session: Not on file  Stress:   . Feeling of Stress : Not on file  Social Connections:   . Frequency of Communication with Friends and Family: Not on file  . Frequency of Social Gatherings with Friends and Family: Not on file  . Attends Religious Services: Not on file  . Active Member of Clubs or Organizations: Not on file  . Attends Banker Meetings: Not on file  . Marital Status: Not on file  Intimate Partner Violence:   . Fear of Current or Ex-Partner: Not on file  . Emotionally Abused: Not on file  . Physically Abused: Not on file  . Sexually Abused: Not on file     Physical Exam   Vitals:   09/24/20 1128  BP: (!) 147/88  Pulse: 97  Resp: 16  Temp: 98.5 F (36.9 C)  SpO2: 98%    CONSTITUTIONAL: Well-appearing, NAD NEURO:  Alert and oriented x 3, no focal deficits EYES:  eyes equal and reactive ENT/NECK:  no LAD, no JVD CARDIO: Regular rate, well-perfused, normal S1 and S2 PULM:  CTAB no wheezing or rhonchi GI/GU:  normal bowel sounds,  non-distended, non-tender MSK/SPINE:  No gross deformities, no edema SKIN:  no rash, atraumatic PSYCH:  Appropriate speech and behavior  *Additional and/or pertinent findings included in MDM below  Diagnostic and Interventional Summary    EKG Interpretation  Date/Time:    Ventricular Rate:    PR Interval:    QRS Duration:   QT Interval:    QTC Calculation:   R Axis:     Text Interpretation:        Labs Reviewed - No data to display  DG Knee Complete 4 Views Right  Final Result      Medications  naproxen (NAPROSYN) tablet 500 mg (500 mg Oral Given 09/24/20 1148)     Procedures  /  Critical Care Procedures  ED Course and Medical Decision Making  I have reviewed the triage vital signs, the nursing notes, and pertinent available records from the EMR.  Listed above are laboratory and imaging tests that I personally ordered, reviewed, and interpreted and  then considered in my medical decision making (see below for details).  No objective evidence of pathology on exam, nontender, no increased warmth, normal range of motion, no erythema, nothing to suggest septic joint or DVT or other acute process.  History consistent with arthritis, obtaining screening x-ray and will advise conservative management at home.     X-ray is reassuring, with evidence to support the diagnosis of arthritis.  Appropriate for discharge.  Elmer Sow. Pilar Plate, MD [facility] Emergency Medicine [facility] [email or web address]  Final Clinical Impressions(s) / ED Diagnoses     ICD-10-CM   1. Acute pain of right knee  M25.561     ED Discharge Orders         Ordered    naproxen (NAPROSYN) 500 MG tablet  2 times daily        09/24/20 1218           Discharge Instructions Discussed with and Provided to Patient:     Discharge Instructions     You were evaluated in the Emergency Department and after careful evaluation, we did not find any emergent condition requiring admission or further testing in the hospital.  Your exam/testing today was overall reassuring.  Your x-ray did not show any broken bones.  It does show some arthritis, which we think is the cause of your discomfort.  We advise rest, Tylenol, and use of the Naprosyn medication provided.  Please return to the Emergency Department if you experience any worsening of your condition.  Thank you for allowing [place] to be a part of your care.        Sabas Sous, MD 09/24/20 [phone]

## 2020-10-27 ENCOUNTER — Ambulatory Visit
Admission: EM | Admit: 2020-10-27 | Discharge: 2020-10-27 | Disposition: A | Discharge status: Home / Self Care | Payer: Medicaid Other | Attending: Family Medicine | Admitting: Family Medicine

## 2020-10-27 DIAGNOSIS — M13861 Other specified arthritis, right knee: Secondary | ICD-10-CM

## 2020-10-27 MED ORDER — PREDNISONE 20 MG PO TABS: ORAL_TABLET | ORAL | 0 refills | Status: DC

## 2020-10-27 MED ORDER — TRAMADOL HCL 50 MG PO TABS: 50.0000 mg | ORAL_TABLET | Freq: Four times a day (QID) | ORAL | 0 refills | Status: DC | PRN

## 2020-10-27 NOTE — ED Provider Notes (Signed)
[facility]    CSN: [id number] Arrival date & time: 10/27/20  1606      History   Chief Complaint Chief Complaint  Patient presents with  . Knee Pain    HPI Monica Duarte is a 64 y.o. female.   This is a 64 year old established [facility] patient who is complaining of right knee pain.  She was seen 1 month ago by a different provider whose note reads:  "CHRISTINA GINTZ is a 64 y.o. year-old female with a history of asthma, obesity presenting to the ED with chief complaint of knee pain.  1 to 2 weeks of right knee pain, constant, worse when bending the knee or walking.  Recently started a job where she has to walk around during the day.  No fever, fell 2 days ago but no head trauma."  She was prescribed Naprosyn 500 mg bid.  This has not helped.   Patient works at [employer] and needs to walk quite a bit.  She is limping.  Just standing is painful.  Also she notes crepitus.     Past Medical History:  Diagnosis Date  . Asthma   . Obesity     Patient Active Problem List   Diagnosis Date Noted  . Chronic right-sided low back pain 03/08/2019  . Anxiety 09/21/2018  . Stress at home 09/21/2018  . Eczema 09/21/2018  . Arthritis 09/21/2018    Past Surgical History:  Procedure Laterality Date  . ectopic    . LAPAROSCOPIC GASTRIC BANDING     2009    OB History   No obstetric history on file.      Home Medications    Prior to Admission medications   Medication Sig Start Date End Date Taking? Authorizing Provider  predniSONE (DELTASONE) 20 MG tablet Two daily with food 10/27/20   Elvina Sidle, MD  traMADol (ULTRAM) 50 MG tablet Take 1 tablet (50 mg total) by mouth every 6 (six) hours as needed. 10/27/20   Elvina Sidle, MD    Family History Family History  Problem Relation Age of Onset  . Hypertension Mother   . Diabetes Mother     Social History Social History   Tobacco Use  . Smoking status: Never Smoker  .  Smokeless tobacco: Never Used  Substance Use Topics  . Alcohol use: Yes    Comment: wine and liquor, most days  . Drug use: No     Allergies   Metformin and related and Sulfa antibiotics   Review of Systems Review of Systems  Musculoskeletal: Positive for gait problem and joint swelling.     Physical Exam Triage Vital Signs ED Triage Vitals  Enc Vitals Group     BP      Pulse      Resp      Temp      Temp src      SpO2      Weight      Height      Head Circumference      Peak Flow      Pain Score      Pain Loc      Pain Edu?      Excl. in GC?    No data found.  Updated Vital Signs BP 107/77 (BP Location: Left Arm)   Pulse 77   Temp 97.7 F (36.5 C) (Oral)   Resp 18   LMP 02/09/2014   SpO2 98%   Physical Exam Vitals and  nursing note reviewed.  Constitutional:      General: She is not in acute distress.    Appearance: Normal appearance. She is obese.  Musculoskeletal:        General: Swelling and tenderness present. No deformity or signs of injury. Normal range of motion.     Comments: Right knee ligaments intact Mild STS right medial knee Tender superior to patella right knee No crepitus felt.  Skin:    General: Skin is warm and dry.  Neurological:     General: No focal deficit present.     Mental Status: She is alert and oriented to person, place, and time.  Psychiatric:        Behavior: Behavior normal.        Thought Content: Thought content normal.      UC Treatments / Results  Labs (all labs ordered are listed, but only abnormal results are displayed) Labs Reviewed - No data to display  EKG   Radiology CLINICAL DATA:  Acute right knee pain without known injury.  EXAM: RIGHT KNEE - COMPLETE 4+ VIEW  COMPARISON:  None.  FINDINGS: No evidence of fracture, dislocation, or joint effusion. Severe narrowing of medial joint space is noted with osteophyte formation. Minimal osteophyte formation is noted laterally. Soft tissues  are unremarkable.  IMPRESSION: Severe degenerative joint disease. No acute abnormality seen in the right knee.   Electronically Signed   By: Lupita Raider M.D.   On: 09/24/2020 11:52  Procedures Procedures (including critical care time)  Medications Ordered in UC Medications - No data to display  Initial Impression / Assessment and Plan / UC Course  I have reviewed the triage vital signs and the nursing notes.  Pertinent labs & imaging results that were available during my care of the patient were reviewed by me and considered in my medical decision making (see chart for details).     Final Clinical Impressions(s) / UC Diagnoses   Final diagnoses:  Allergic arthritis of right knee     Discharge Instructions     CLINICAL DATA:  Acute right knee pain without known injury.   EXAM: RIGHT KNEE - COMPLETE 4+ VIEW   COMPARISON:  None.   FINDINGS: No evidence of fracture, dislocation, or joint effusion. Severe narrowing of medial joint space is noted with osteophyte formation. Minimal osteophyte formation is noted laterally. Soft tissues are unremarkable.   IMPRESSION: Severe degenerative joint disease. No acute abnormality seen in the right knee.     Electronically Signed   By: Lupita Raider M.D.   On: 09/24/2020 11:52      ED Prescriptions    Medication Sig Dispense Auth. Provider   predniSONE (DELTASONE) 20 MG tablet Two daily with food 10 tablet Elvina Sidle, MD   traMADol (ULTRAM) 50 MG tablet Take 1 tablet (50 mg total) by mouth every 6 (six) hours as needed. 15 tablet Elvina Sidle, MD     I have reviewed the PDMP during this encounter.   Elvina Sidle, MD 10/27/20 1722

## 2020-10-27 NOTE — ED Triage Notes (Signed)
Pt c/o rt knee pain for over a month. Denies injury. States has been tx's in the past with no relief.

## 2020-10-27 NOTE — Discharge Instructions (Addendum)
CLINICAL DATA:  Acute right knee pain without known injury.   EXAM: RIGHT KNEE - COMPLETE 4+ VIEW   COMPARISON:  None.   FINDINGS: No evidence of fracture, dislocation, or joint effusion. Severe narrowing of medial joint space is noted with osteophyte formation. Minimal osteophyte formation is noted laterally. Soft tissues are unremarkable.   IMPRESSION: Severe degenerative joint disease. No acute abnormality seen in the right knee.     Electronically Signed   By: Lupita Raider M.D.   On: 09/24/2020 11:52

## 2020-11-22 DIAGNOSIS — I10 Essential (primary) hypertension: Secondary | ICD-10-CM | POA: Insufficient documentation

## 2020-12-22 ENCOUNTER — Encounter: Payer: Self-pay | Admitting: Emergency Medicine

## 2020-12-22 ENCOUNTER — Ambulatory Visit
Admission: EM | Admit: 2020-12-22 | Discharge: 2020-12-22 | Disposition: A | Discharge status: Home / Self Care | Payer: Medicaid Other | Attending: Internal Medicine | Admitting: Internal Medicine

## 2020-12-22 ENCOUNTER — Other Ambulatory Visit: Payer: Self-pay

## 2020-12-22 DIAGNOSIS — M549 Dorsalgia, unspecified: Secondary | ICD-10-CM

## 2020-12-22 HISTORY — DX: Essential (primary) hypertension: I10

## 2020-12-22 MED ORDER — TIZANIDINE HCL 4 MG PO TABS: 4.0000 mg | ORAL_TABLET | Freq: Every evening | ORAL | 0 refills | Status: DC | PRN

## 2020-12-22 MED ORDER — IBUPROFEN 600 MG PO TABS: 600.0000 mg | ORAL_TABLET | Freq: Four times a day (QID) | ORAL | 0 refills | Status: DC | PRN

## 2020-12-22 MED ORDER — TIZANIDINE HCL 4 MG PO TABS: 4.0000 mg | ORAL_TABLET | Freq: Three times a day (TID) | ORAL | 0 refills | Status: DC | PRN
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
## 2020-12-22 NOTE — Discharge Instructions (Signed)
Gentle range of motion exercises Take medications as tolerated If symptoms worsen please return to the urgent care to be reevaluated.
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
## 2021-04-24 ENCOUNTER — Emergency Department (HOSPITAL_COMMUNITY)
Admission: EM | Admit: 2021-04-24 | Discharge: 2021-04-24 | Disposition: A | Discharge status: Home / Self Care | Payer: Medicaid Other | Attending: Emergency Medicine | Admitting: Emergency Medicine

## 2021-04-24 ENCOUNTER — Encounter (HOSPITAL_COMMUNITY): Payer: Self-pay

## 2021-04-24 ENCOUNTER — Other Ambulatory Visit: Payer: Self-pay

## 2021-04-24 ENCOUNTER — Emergency Department (HOSPITAL_COMMUNITY): Payer: Medicaid Other

## 2021-04-24 DIAGNOSIS — J45909 Unspecified asthma, uncomplicated: Secondary | ICD-10-CM | POA: Insufficient documentation

## 2021-04-24 DIAGNOSIS — M25552 Pain in left hip: Secondary | ICD-10-CM | POA: Diagnosis present

## 2021-04-24 DIAGNOSIS — R200 Anesthesia of skin: Secondary | ICD-10-CM | POA: Insufficient documentation

## 2021-04-24 DIAGNOSIS — I10 Essential (primary) hypertension: Secondary | ICD-10-CM | POA: Insufficient documentation

## 2021-04-24 DIAGNOSIS — M545 Low back pain, unspecified: Secondary | ICD-10-CM | POA: Insufficient documentation

## 2021-04-24 MED ORDER — MELOXICAM 7.5 MG PO TABS: 15.0000 mg | ORAL_TABLET | Freq: Every day | ORAL | 0 refills | Status: AC

## 2021-04-24 MED ORDER — CYCLOBENZAPRINE HCL 10 MG PO TABS
10.0000 mg | ORAL_TABLET | Freq: Once | ORAL | Status: AC
  Administered 2021-04-24: 10 mg via ORAL
  Filled 2021-04-24: qty 1

## 2021-04-24 MED ORDER — KETOROLAC TROMETHAMINE 15 MG/ML IJ SOLN
15.0000 mg | Freq: Once | INTRAMUSCULAR | Status: AC
  Administered 2021-04-24: 15 mg via INTRAMUSCULAR
  Filled 2021-04-24: qty 1

## 2021-04-24 NOTE — ED Triage Notes (Signed)
Patient c/o tingling in her left hand only this AM Patient also c/o left hip pain that radiates into the left thigh since yesterday.

## 2021-04-24 NOTE — ED Provider Notes (Signed)
[facility]-EMERGENCY DEPT Provider Note   CSN: [id number] Arrival date & time: 04/24/21  0930     History Chief Complaint  Patient presents with  . hand tingling    Monica Duarte is a 65 y.o. female.  65yo F w/ PMH including asthma, HTN, gastric banding, and obesity who p/w L hip/thigh pain. Yesterday, she woke up in the morning w/ 7/10 pain in left hip/back area that radiates to mid-thigh. It is a constant, deep pain that is worse w/ movement or pressure and nothing makes pain better. She's taken tylenol, tramadol, and zanaflex without relief. She's never had pain like this before. She does have chronic R knee pain. She also reports some numbness in L hand only that began this morning but has resolved throughout the day. No associated weakness, N/V/D, fever, chills. No bowel/bladder incontinence, saddle anesthesia, or problems walking.  The history is provided by the patient.       Past Medical History:  Diagnosis Date  . Asthma   . Hypertension   . Obesity     Patient Active Problem List   Diagnosis Date Noted  . Chronic right-sided low back pain 03/08/2019  . Anxiety 09/21/2018  . Stress at home 09/21/2018  . Eczema 09/21/2018  . Arthritis 09/21/2018    Past Surgical History:  Procedure Laterality Date  . ectopic    . LAPAROSCOPIC GASTRIC BANDING     2009     OB History   No obstetric history on file.     Family History  Problem Relation Age of Onset  . Hypertension Mother   . Diabetes Mother     Social History   Tobacco Use  . Smoking status: Never Smoker  . Smokeless tobacco: Never Used  Vaping Use  . Vaping Use: Never used  Substance Use Topics  . Alcohol use: Yes    Comment: wine and liquor, most days  . Drug use: No    Home Medications Prior to Admission medications   Medication Sig Start Date End Date Taking? Authorizing Provider  meloxicam (MOBIC) 7.5 MG tablet Take 2 tablets (15 mg total) by mouth daily for  7 days. 04/24/21 05/01/21 Yes Maghan Jessee, Ambrose Finland, MD  tiZANidine (ZANAFLEX) 4 MG tablet Take 1 tablet (4 mg total) by mouth at bedtime as needed for muscle spasms. 12/22/20   LampteyBritta Mccreedy, MD    Allergies    Metformin and related and Sulfa antibiotics  Review of Systems   Review of Systems All other systems reviewed and are negative except that which was mentioned in HPI  Physical Exam Updated Vital Signs BP 109/78   Pulse 76   Temp 97.8 F (36.6 C) (Oral)   Resp (!) 23   Ht 5\' 6"  (1.676 m)   Wt 124.7 kg   LMP 02/09/2014   SpO2 100%   BMI 44.39 kg/m   Physical Exam Vitals and nursing note reviewed.  Constitutional:      General: She is not in acute distress.    Appearance: Normal appearance. She is obese.     Comments: Uncomfortable, moving around in bed  HENT:     Head: Normocephalic and atraumatic.  Eyes:     Conjunctiva/sclera: Conjunctivae normal.  Cardiovascular:     Rate and Rhythm: Normal rate and regular rhythm.     Pulses: Normal pulses.     Heart sounds: Normal heart sounds. No murmur heard.   Pulmonary:     Effort: Pulmonary effort is  normal.     Breath sounds: Normal breath sounds.  Abdominal:     General: Abdomen is flat. Bowel sounds are normal. There is no distension.     Palpations: Abdomen is soft.     Tenderness: There is no abdominal tenderness.  Musculoskeletal:        General: Normal range of motion.     Right lower leg: No edema.     Left lower leg: No edema.     Comments: Normal ROM L hip and knee without difficulty, no focal areas of tenderness on lower back  Skin:    General: Skin is warm and dry.     Findings: No rash.  Neurological:     Mental Status: She is alert and oriented to person, place, and time.     Sensory: No sensory deficit.     Motor: No weakness.     Comments: fluent  Psychiatric:        Mood and Affect: Mood normal.        Behavior: Behavior normal.     ED Results / Procedures / Treatments   Labs (all  labs ordered are listed, but only abnormal results are displayed) Labs Reviewed - No data to display  EKG None  Radiology DG Lumbar Spine Complete  Result Date: 04/24/2021 CLINICAL DATA:  Low back pain radiating to the left hip for the past day. EXAM: LUMBAR SPINE - COMPLETE 4+ VIEW COMPARISON:  03/08/2019 FINDINGS: There are 5 non rib-bearing lumbar type vertebral bodies. Grade 1 anterolisthesis of L3 upon L4 measuring approximately 4 mm, similar to the 02/2019 examination. No retrolisthesis. No definite pars defects. Lumbar vertebral body heights appear preserved Mild to moderate multilevel lumbar spine DDD, worse at L3-L4 and L4-L5 with disc space height loss, endplate irregularity and sclerosis Limited visualization the bilateral SI joints is normal. Gastric banding device is seen within the abdomen. Nonobstructive bowel gas pattern. IMPRESSION: Mild-to-moderate multilevel lumbar spine DDD, worse at L3-L4 and L4-L5, grossly unchanged compared to the 02/2019 examination. Electronically Signed   By: Simonne Come M.D.   On: 04/24/2021 13:19   DG Hip Unilat W or Wo Pelvis 2-3 Views Left  Result Date: 04/24/2021 CLINICAL DATA:  Low back and left hip pain. EXAM: DG HIP (WITH OR WITHOUT PELVIS) 2-3V LEFT COMPARISON:  Right hip radiographic series-02/27/2014; lumbar spine radiographs-12/18/2010 FINDINGS: No fracture or dislocation. Mild degenerative change the left hip with joint space loss, subchondral sclerosis osteophytosis. No evidence of avascular necrosis Limited visualization of the pelvis suggests mild degenerative change the pubic symphysis with potential mild asymmetric degenerative change of the left SI joint, similar to remote lumbar spine radiographs performed in 2012. Limited visualization the contralateral right hip suggests similar mild degenerative change, incompletely evaluated. Multiple phleboliths overlie the lower pelvis bilaterally. No radiopaque foreign body. IMPRESSION: 1. Mild  degenerative change of the left hip. 2. Mild degenerative change of the pubic symphysis with potential mild asymmetric degenerative change of the left SI joint similar to remote lumbar spine radiographs performed in 2012. Electronically Signed   By: Simonne Come M.D.   On: 04/24/2021 13:22    Procedures Procedures   Medications Ordered in ED Medications  ketorolac (TORADOL) 15 MG/ML injection 15 mg (15 mg Intramuscular Given 04/24/21 1216)  cyclobenzaprine (FLEXERIL) tablet 10 mg (10 mg Oral Given 04/24/21 1216)    ED Course  I have reviewed the triage vital signs and the nursing notes.  Pertinent imaging results that were available during my care  of the patient were reviewed by me and considered in my medical decision making (see chart for details).    MDM Rules/Calculators/A&P                          Neurologically intact on exam, no signs or symptoms to suggest cauda equina or infectious process.  Plain films of lumbar spine and hip show degenerative changes without acute findings.  Patient's creatinine has been normal previously and she denies renal problems.  Gave IM Toradol and she reported significant improvement in symptoms.  Recommended trial of Mobic and discussed supportive measures including lidocaine patches, heat therapy, range of motion exercises.  Also recommended that she follow-up with [place] [facility] where she has been seen previously for low back pain.  I have extensively reviewed return precautions with her and she voiced understanding. Final Clinical Impression(s) / ED Diagnoses Final diagnoses:  Left hip pain  Acute left-sided low back pain without sciatica    Rx / DC Orders ED Discharge Orders         Ordered    meloxicam (MOBIC) 7.5 MG tablet  Daily        04/24/21 1404           Curtistine Pettitt, Ambrose Finland, MD 04/24/21 1557

## 2021-04-24 NOTE — Discharge Instructions (Addendum)
Take tylenol 1000mg  every 6 hours (no more than 4 doses in 24 hour period.) Take Mobic as prescribed. Do not take ibuprofen/aleve/advil/motrin while taking this medication. Use muscle relaxant prescription as prescribed, with caution as it may make you sleepy. Use lidocaine patches on area of back that is most tender.  Use heat (bath or heating pad) several times daily. Do not sleep on heating pad. Move around as much as possible and try to avoid long periods of sitting or not moving.  RETURN TO ER IF ANY LEG WEAKNESS/NUMBNESS, BOWEL OR BLADDER PROBLEMS, OR FEVER.

## 2021-05-18 ENCOUNTER — Ambulatory Visit: Payer: Medicaid Other | Admitting: Orthopaedic Surgery

## 2021-05-25 ENCOUNTER — Ambulatory Visit: Payer: Medicaid Other | Admitting: Orthopaedic Surgery

## 2021-06-16 ENCOUNTER — Encounter: Payer: Self-pay | Admitting: Orthopaedic Surgery

## 2021-06-16 ENCOUNTER — Other Ambulatory Visit: Payer: Self-pay

## 2021-06-16 ENCOUNTER — Ambulatory Visit (INDEPENDENT_AMBULATORY_CARE_PROVIDER_SITE_OTHER): Payer: Medicaid Other | Admitting: Orthopaedic Surgery

## 2021-06-16 DIAGNOSIS — M1711 Unilateral primary osteoarthritis, right knee: Secondary | ICD-10-CM | POA: Diagnosis not present

## 2021-06-16 DIAGNOSIS — Z6841 Body Mass Index (BMI) 40.0 and over, adult: Secondary | ICD-10-CM | POA: Diagnosis not present

## 2021-06-16 DIAGNOSIS — M65332 Trigger finger, left middle finger: Secondary | ICD-10-CM | POA: Diagnosis not present

## 2021-06-16 DIAGNOSIS — E669 Obesity, unspecified: Secondary | ICD-10-CM | POA: Insufficient documentation

## 2021-06-16 MED ORDER — LIDOCAINE HCL 1 % IJ SOLN
2.0000 mL | INTRAMUSCULAR | Status: AC | PRN
  Administered 2021-06-16: 2 mL

## 2021-06-16 MED ORDER — BUPIVACAINE HCL 0.25 % IJ SOLN
2.0000 mL | INTRAMUSCULAR | Status: AC | PRN
  Administered 2021-06-16: 2 mL via INTRA_ARTICULAR

## 2021-06-16 MED ORDER — METHYLPREDNISOLONE ACETATE 40 MG/ML IJ SUSP
80.0000 mg | INTRAMUSCULAR | Status: AC | PRN
  Administered 2021-06-16: 80 mg via INTRA_ARTICULAR

## 2021-06-16 NOTE — Progress Notes (Signed)
Office Visit Note   Patient: Monica Duarte           Date of Birth: [birth year]           MRN: [id number] Visit Date: 06/16/2021              Requested by: No referring provider defined for this encounter. PCP: Patient, No Pcp Per (Inactive)   Assessment & Plan: Visit Diagnoses:  1. Trigger finger, left middle finger   2. Unilateral primary osteoarthritis, right knee   3. Class 3 severe obesity due to excess calories without serious comorbidity with body mass index (BMI) of 40.0 to 44.9 in adult [facility])     Plan: Left long active trigger finger.  Discussed pathology and different treatment options including splinting, injection or surgery.  She is going to try the splinting at home "first".  We will plan to see her back as needed.  Films demonstrate significant osteoarthritis in all 3 compartments right knee but predominantly medially.  Discussed different treatment options and even knee replacement surgery but she would have to lose weight with a BMI of 44.  She is aware of this and is "working out".  Would like cortisone injection.  We will perform along the medial compartment and monitor her response.  Might be a candidate for viscosupplementation  Follow-Up Instructions: Return if symptoms worsen or fail to improve.   Orders:  Orders Placed This Encounter  Procedures   Large Joint Inj: R knee   No orders of the defined types were placed in this encounter.     Procedures: Large Joint Inj: R knee on 06/16/2021 4:08 PM Indications: pain and diagnostic evaluation Details: 25 G 1.5 in needle, anteromedial approach  Arthrogram: No  Medications: 2 mL lidocaine 1 %; 80 mg methylPREDNISolone acetate 40 MG/ML; 2 mL bupivacaine 0.25 % Procedure, treatment alternatives, risks and benefits explained, specific risks discussed. Consent was given by the patient. Immediately prior to procedure a time out was called to verify the correct patient, procedure, equipment, support staff and  site/side marked as required. Patient was prepped and draped in the usual sterile fashion.      Clinical Data: No additional findings.   Subjective: Chief Complaint  Patient presents with   Left Hip - Pain   Right Knee - Pain   Left Hand - Pain    Left long finger  Patient presents today with multiple complaints. She has been having right knee pain for years. She said that it worsening with time. She said that her knee hurts in the joint and swells. She experiences grinding and giving way. No previous knee surgery. She has been having left lateral hip pain for months. No injury. No groin pain or numbness. She does have pain that radiates down her leg, along with lower back pain. And lastly she has complaints of left long finger pain. She states that her finger "draws up" at night while she is sleeping. She states that it gets stuck and painful. She is right hand dominant. 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 She has had x-rays taken prior to today of her hip, lower back, and knee. They are in PACS Films of the right knee were reviewed from 2021.  These demonstrate tricompartmental degenerative changes predominate in the medial compartment where there is significant narrowing in about 2 to 3 degrees of varus.  There is peripheral osteophyte formation and subchondral sclerosis.  Lesser  changes in the patellofemoral and lateral compartments  HPI  Review of Systems   Objective: Vital Signs: Ht 5\' 6"  (1.676 m)   Wt 275 lb (124.7 kg)   LMP 02/09/2014   BMI 44.39 kg/m   Physical Exam Constitutional:      Appearance: She is well-developed.  Eyes:     Pupils: Pupils are equal, round, and reactive to light.  Pulmonary:     Effort: Pulmonary effort is normal.  Skin:    General: Skin is warm and dry.  Neurological:     Mental Status: She is alert and oriented to person, place, and time.  Psychiatric:        Behavior: Behavior normal.    Ortho Exam  awake alert and oriented x3.  Comfortable sitting.  Left hand with active triggering of the long finger associated with a painful nodule on the palmar aspect near the metacarpal phalangeal joint.  No swelling of the digit.  Neurologically intact.  No pain about the metacarpal phalangeal joint.  Right knee was large.  Difficult to determine if there was an effusion.  Mostly medial joint pain.  Slight increased varus with weightbearing.  Full extension flexion about 95 degrees without instability.  No lateral joint pain.  No popliteal pain or mass.  No calf pain  Specialty Comments:  No specialty comments available.  Imaging: No results found.   PMFS History: Patient Active Problem List   Diagnosis Date Noted   Trigger finger, left middle finger 06/16/2021   Unilateral primary osteoarthritis, right knee 06/16/2021   Obesity    Chronic right-sided low back pain 03/08/2019   Anxiety 09/21/2018   Stress at home 09/21/2018   Eczema 09/21/2018   Arthritis 09/21/2018   Past Medical History:  Diagnosis Date   Asthma    Hypertension    Obesity     Family History  Problem Relation Age of Onset   Hypertension Mother    Diabetes Mother     Past Surgical History:  Procedure Laterality Date   ectopic     LAPAROSCOPIC GASTRIC BANDING     2009   Social History   Occupational History   Not on file  Tobacco Use   Smoking status: Never   Smokeless tobacco: Never  Vaping Use   Vaping Use: Never used  Substance and Sexual Activity   Alcohol use: Yes    Comment: wine and liquor, most days   Drug use: No   Sexual activity: Not on file

## 2021-11-24 ENCOUNTER — Emergency Department (HOSPITAL_COMMUNITY): Payer: Medicaid Other

## 2021-11-24 ENCOUNTER — Encounter (HOSPITAL_COMMUNITY): Payer: Self-pay | Admitting: Oncology

## 2021-11-24 ENCOUNTER — Other Ambulatory Visit: Payer: Self-pay

## 2021-11-24 ENCOUNTER — Emergency Department (HOSPITAL_COMMUNITY)
Admission: EM | Admit: 2021-11-24 | Discharge: 2021-11-24 | Disposition: A | Discharge status: Home / Self Care | Payer: Medicaid Other | Attending: Emergency Medicine | Admitting: Emergency Medicine

## 2021-11-24 DIAGNOSIS — I10 Essential (primary) hypertension: Secondary | ICD-10-CM | POA: Diagnosis not present

## 2021-11-24 DIAGNOSIS — J45909 Unspecified asthma, uncomplicated: Secondary | ICD-10-CM | POA: Diagnosis not present

## 2021-11-24 DIAGNOSIS — R109 Unspecified abdominal pain: Secondary | ICD-10-CM | POA: Diagnosis not present

## 2021-11-24 DIAGNOSIS — M545 Low back pain, unspecified: Secondary | ICD-10-CM

## 2021-11-24 LAB — CBC WITH DIFFERENTIAL/PLATELET
Abs Immature Granulocytes: 0.05 10*3/uL (ref 0.00–0.07)
Basophils Absolute: 0.1 10*3/uL (ref 0.0–0.1)
Basophils Relative: 1 %
Eosinophils Absolute: 0.1 10*3/uL (ref 0.0–0.5)
Eosinophils Relative: 1 %
HCT: 46.2 % — ABNORMAL HIGH (ref 36.0–46.0)
Hemoglobin: 15 g/dL (ref 12.0–15.0)
Immature Granulocytes: 1 %
Lymphocytes Relative: 27 %
Lymphs Abs: 2.1 10*3/uL (ref 0.7–4.0)
MCH: 26 pg (ref 26.0–34.0)
MCHC: 32.5 g/dL (ref 30.0–36.0)
MCV: 80.2 fL (ref 80.0–100.0)
Monocytes Absolute: 0.6 10*3/uL (ref 0.1–1.0)
Monocytes Relative: 8 %
Neutro Abs: 4.8 10*3/uL (ref 1.7–7.7)
Neutrophils Relative %: 62 %
Platelets: 354 10*3/uL (ref 150–400)
RBC: 5.76 MIL/uL — ABNORMAL HIGH (ref 3.87–5.11)
RDW: 14.9 % (ref 11.5–15.5)
WBC: 7.7 10*3/uL (ref 4.0–10.5)
nRBC: 0 % (ref 0.0–0.2)

## 2021-11-24 LAB — COMPREHENSIVE METABOLIC PANEL
ALT: 20 U/L (ref 0–44)
AST: 18 U/L (ref 15–41)
Albumin: 3.8 g/dL (ref 3.5–5.0)
Alkaline Phosphatase: 71 U/L (ref 38–126)
Anion gap: 8 (ref 5–15)
BUN: 15 mg/dL (ref 8–23)
CO2: 28 mmol/L (ref 22–32)
Calcium: 9.4 mg/dL (ref 8.9–10.3)
Chloride: 103 mmol/L (ref 98–111)
Creatinine, Ser: 0.94 mg/dL (ref 0.44–1.00)
GFR, Estimated: >60 mL/min (ref >60)
Glucose, Bld: 146 mg/dL — ABNORMAL HIGH (ref 70–99)
Potassium: 3.5 mmol/L (ref 3.5–5.1)
Sodium: 139 mmol/L (ref 135–145)
Total Bilirubin: 0.6 mg/dL (ref 0.3–1.2)
Total Protein: 7.5 g/dL (ref 6.5–8.1)

## 2021-11-24 LAB — URINALYSIS, ROUTINE W REFLEX MICROSCOPIC
Bilirubin Urine: NEGATIVE
Glucose, UA: NEGATIVE mg/dL
Ketones, ur: NEGATIVE mg/dL
Leukocytes,Ua: NEGATIVE
Nitrite: NEGATIVE
Protein, ur: NEGATIVE mg/dL
Specific Gravity, Urine: 1.02 (ref 1.005–1.030)
pH: 5.5 (ref 5.0–8.0)

## 2021-11-24 LAB — URINALYSIS, MICROSCOPIC (REFLEX)

## 2021-11-24 NOTE — ED Triage Notes (Signed)
Pt reports 4 days of left flank pain.  Denies urinary sx.

## 2021-11-24 NOTE — Discharge Instructions (Signed)
Your evaluation today is reassuring, we do not see signs of a kidney stone and your kidney function is normal, no signs of urinary tract infection.  You could have recently passed a kidney stone and this could be musculoskeletal back pain.  Continue treating supportively with over-the-counter Motrin, Tylenol, ice and heat.  Follow-up with your primary care provider if symptoms or not improving.  If you develop new or worsening symptoms return for reevaluation.

## 2021-11-24 NOTE — ED Provider Notes (Signed)
[name]-EMERGENCY DEPT Provider Note   CSN: [id number] Arrival date & time: 11/24/21  1803     History Chief Complaint  Patient presents with   Flank Pain    Monica Duarte is a 65 y.o. female.  Monica Duarte is a 65 y.o. female with a history of hypertension, asthma, chronic back pain, who presents to the ED for 4 days of left lower back pain.  Patient reports this is sharp in nature and episodic.  She reports currently she is not having any pain.  Nothing in particular seems to trigger the pain but it comes and goes.  She denies any associated pain radiating into her leg.  No numbness, weakness or tingling.  No loss of bowel or bladder control or saddle anesthesia.  No associated dysuria, urinary frequency or hematuria.  No abdominal pain.  No fevers or chills, no nausea or vomiting.  No injury to the back.  No history of IV drug use.  No history of kidney stones.  No other aggravating or alleviating factors.  The history is provided by the patient.      Past Medical History:  Diagnosis Date   Asthma    Hypertension    Obesity     Patient Active Problem List   Diagnosis Date Noted   Trigger finger, left middle finger 06/16/2021   Unilateral primary osteoarthritis, right knee 06/16/2021   Obesity    Chronic right-sided low back pain 03/08/2019   Anxiety 09/21/2018   Stress at home 09/21/2018   Eczema 09/21/2018   Arthritis 09/21/2018    Past Surgical History:  Procedure Laterality Date   ectopic     LAPAROSCOPIC GASTRIC BANDING     2009     OB History   No obstetric history on file.     Family History  Problem Relation Age of Onset   Hypertension Mother    Diabetes Mother     Social History   Tobacco Use   Smoking status: Never   Smokeless tobacco: Never  Vaping Use   Vaping Use: Never used  Substance Use Topics   Alcohol use: Yes    Comment: wine and liquor, most days   Drug use: No    Home  Medications Prior to Admission medications   Medication Sig Start Date End Date Taking? Authorizing Provider  tiZANidine (ZANAFLEX) 4 MG tablet Take 1 tablet (4 mg total) by mouth at bedtime as needed for muscle spasms. 12/22/20   LampteyBritta Mccreedy, MD    Allergies    Metformin and related and Sulfa antibiotics  Review of Systems   Review of Systems  Constitutional:  Negative for chills and fever.  HENT: Negative.    Respiratory:  Negative for shortness of breath.   Cardiovascular:  Negative for chest pain.  Gastrointestinal:  Negative for abdominal pain, constipation, diarrhea, nausea and vomiting.  Genitourinary:  Positive for flank pain. Negative for dysuria, frequency and hematuria.  Musculoskeletal:  Positive for back pain. Negative for arthralgias, gait problem, joint swelling, myalgias and neck pain.  Skin:  Negative for color change, rash and wound.  Neurological:  Negative for weakness and numbness.  All other systems reviewed and are negative.  Physical Exam Updated Vital Signs BP 127/82    Pulse (!) 113    Temp 98.3 F (36.8 C) (Oral)    Resp 18    LMP 02/09/2014    SpO2 94%   Physical Exam Vitals and nursing note reviewed.  Constitutional:      General: She is not in acute distress.    Appearance: Normal appearance. She is well-developed. She is obese. She is not ill-appearing or diaphoretic.  HENT:     Head: Atraumatic.  Eyes:     General:        Right eye: No discharge.        Left eye: No discharge.  Cardiovascular:     Rate and Rhythm: Normal rate and regular rhythm.     Pulses: Normal pulses.          Radial pulses are 2+ on the right side and 2+ on the left side.       Dorsalis pedis pulses are 2+ on the right side and 2+ on the left side.       Posterior tibial pulses are 2+ on the right side and 2+ on the left side.     Heart sounds: Normal heart sounds.  Pulmonary:     Effort: Pulmonary effort is normal. No respiratory distress.     Breath sounds:  Normal breath sounds.     Comments: Respirations equal and unlabored, patient able to speak in full sentences, lungs clear to auscultation bilaterally Abdominal:     General: Bowel sounds are normal. There is no distension.     Palpations: Abdomen is soft. There is no mass.     Tenderness: There is no abdominal tenderness. There is no guarding.     Comments: Abdomen soft, nondistended, nontender to palpation in all quadrants without guarding or peritoneal signs, no CVA tenderness bilaterally  Musculoskeletal:     Cervical back: Neck supple.     Comments: No tenderness over the midline lumbar spine, no reproducible tenderness over the left low back, no overlying skin changes.  Negative straight leg raise bilaterally.  Skin:    General: Skin is warm and dry.     Capillary Refill: Capillary refill takes less than 2 seconds.  Neurological:     Mental Status: She is alert and oriented to person, place, and time.     Comments: Alert, clear speech, following commands. Moving all extremities without difficulty. Bilateral lower extremities with 5/5 strength in proximal and distal muscle groups and with dorsi and plantar flexion. Sensation intact in bilateral lower extremities. 2+ patellar DTRs bilaterally. Ambulatory with steady gait  Psychiatric:        Behavior: Behavior normal.    ED Results / Procedures / Treatments   Labs (all labs ordered are listed, but only abnormal results are displayed) Labs Reviewed  CBC WITH DIFFERENTIAL/PLATELET - Abnormal; Notable for the following components:      Result Value   RBC 5.76 (*)    HCT 46.2 (*)    All other components within normal limits  URINALYSIS, ROUTINE W REFLEX MICROSCOPIC - Abnormal; Notable for the following components:   Hgb urine dipstick SMALL (*)    All other components within normal limits  COMPREHENSIVE METABOLIC PANEL - Abnormal; Notable for the following components:   Glucose, Bld 146 (*)    All other components within normal  limits  URINALYSIS, MICROSCOPIC (REFLEX) - Abnormal; Notable for the following components:   Bacteria, UA RARE (*)    All other components within normal limits    EKG None  Radiology DG Lumbar Spine Complete  Result Date: 11/24/2021 CLINICAL DATA:  Back pain, acute low back pain without injury. EXAM: LUMBAR SPINE - COMPLETE 4+ VIEW COMPARISON:  None. FINDINGS: There is no evidence of lumbar  spine fracture. Alignment is normal. Mild multilevel degenerative disc disease with disc space narrowing and marginal osteophytes prominent at L4-L5 and L5-S1 with associated facet joint arthropathy. IMPRESSION: 1.  No evidence of acute fracture or subluxation. 2. Mild multilevel degenerate disc disease most prominent at L4-L5 and L5-S1 with associated facet joint arthropathy. Electronically Signed   By: Keane Police D.O.   On: 11/24/2021 18:53   CT Renal Stone Study  Result Date: 11/24/2021 CLINICAL DATA:  Four days of left flank pain. EXAM: CT ABDOMEN AND PELVIS WITHOUT CONTRAST TECHNIQUE: Multidetector CT imaging of the abdomen and pelvis was performed following the standard protocol without IV contrast. COMPARISON:  None. FINDINGS: Lower chest: The lung bases are clear of acute process. No pleural effusion or pulmonary lesions. The heart is normal in size. No pericardial effusion. Small hiatal hernia. Hepatobiliary: No hepatic lesions are identified without contrast. No intrahepatic biliary dilatation. The gallbladder is unremarkable. No common bile duct dilatation. Pancreas: No mass, inflammation or ductal dilatation. Spleen: Normal size.  No focal lesions. Adrenals/Urinary Tract: Adrenal glands are normal. No renal lesions or renal calculi. No hydronephrosis. No obstructing ureteral calculi or bladder calculi. Stomach/Bowel: A gastric lap band is in place. No complicating features are identified. The stomach, duodenum, small bowel and colon are otherwise unremarkable. No acute inflammatory process, mass  lesions or obstructive findings. Vascular/Lymphatic: The aorta is normal in caliber. No atheroscerlotic calcifications. No mesenteric of retroperitoneal mass or adenopathy. Small scattered lymph nodes are noted. Reproductive: The uterus and ovaries are unremarkable. Small uterine fibroids are noted. Other: No pelvic mass or adenopathy. No free pelvic fluid collections. No inguinal mass or adenopathy. No abdominal wall hernia or subcutaneous lesions. Musculoskeletal: No significant bony findings. IMPRESSION: 1. No acute abdominal/pelvic findings, mass lesions or adenopathy. 2. No renal, ureteral or bladder calculi or hydronephrosis. 3. Gastric lap band in place without complicating features. 4. Small uterine fibroids. Electronically Signed   By: Marijo Sanes M.D.   On: 11/24/2021 21:32    Procedures Procedures   Medications Ordered in ED Medications - No data to display  ED Course  I have reviewed the triage vital signs and the nursing notes.  Pertinent labs & imaging results that were available during my care of the patient were reviewed by me and considered in my medical decision making (see chart for details).    MDM Rules/Calculators/A&P                           Patient presents to the ED with complaints of episodic left low back pain.  Nontoxic, patient initially mildly tachycardic but this resolved without intervention, vitals otherwise normal  Additional history obtained:  Additional history obtained from chart review & nursing note review.   Lab Tests:  I Ordered, reviewed, and interpreted labs, which included:  CBC: No leukocytosis, normal hemoglobin CMP: Glucose 146, no other electrolyte derangements, normal renal and liver function UA: Small amount of hemoglobin but no signs of infection  Imaging Studies ordered:  I ordered imaging studies which included lumbar spine films, CT renal stone study, I independently reviewed, formal radiology impression shows:  X-rays of the  lumbar spine were ordered from triage, some multilevel degenerative disc disease noted but no acute fracture.  CT renal stone study with no evidence of of nephrolithiasis or other acute abnormalities.  ED Course:  Patient has no back pain red flags.  No neurologic deficits, ambulatory- doubt cauda equina syndrome or  acute cord compression. Afebrile, no hx of IVDU- doubt epidural abscess.  Urine without signs of infection and CT with no evidence of renal stone.  Patient could have recently passed a kidney stone.  Symmetric pulses, not a tearing sensation, overall well appearing, feel that dissection is unlikely at this time. Favor musculoskeletal pain.  We will have patient continue to treat pain supportively and follow-up with her primary care doctor for further evaluation.  Return precautions provided.  Discharged home in good condition.  Portions of this note were generated with [employer]. Dictation errors may occur despite best attempts at proofreading.   Final Clinical Impression(s) / ED Diagnoses Final diagnoses:  Acute left-sided low back pain without sciatica    Rx / DC Orders ED Discharge Orders     None        Janet Berlin 11/24/21 2239    Dorie Rank, MD 11/28/21 1718

## 2021-11-24 NOTE — ED Provider Notes (Signed)
Emergency Medicine Provider Triage Evaluation Note  Monica Duarte , a 65 y.o. female  was evaluated in triage.  Pt complains of left lower back pain sharp in nature, like stabbing that is worse with walking, somewhat positional. Hx of chronic pain medication but denies constipation. Denies dysuria, hematuria, hx kidney stones, abdominal pain, nausea, vomiting, but is "worried about her kidneys". Denies CP, SOB.  Review of Systems  Positive: As above Negative: As above  Physical Exam  BP 127/82 (BP Location: Left Arm)    Pulse (!) 111    Temp 98.3 F (36.8 C) (Oral)    Resp 16    LMP 02/09/2014    SpO2 94%  Gen:   Awake, no distress   Resp:  Normal effort  MSK:   Moves extremities without difficulty  Other:  I cannot replicate her LL back pain on exam but she points to Left paraspinous muscles  Medical Decision Making  Medically screening exam initiated at 6:21 PM.  Appropriate orders placed.  Monica Duarte was informed that the remainder of the evaluation will be completed by another provider, this initial triage assessment does not replace that evaluation, and the importance of remaining in the ED until their evaluation is complete.  LL back pain   Olene Floss, PA-C 11/24/21 1825    Cathren Laine, MD 11/24/21 [phone]

## 2022-04-06 ENCOUNTER — Ambulatory Visit: Payer: Medicaid Other | Admitting: Family Medicine

## 2022-04-06 VITALS — BP 135/91 | HR 84 | Temp 97.6°F | Resp 12 | Wt 297.6 lb

## 2022-04-06 DIAGNOSIS — Z9884 Bariatric surgery status: Secondary | ICD-10-CM | POA: Diagnosis not present

## 2022-04-06 DIAGNOSIS — M1711 Unilateral primary osteoarthritis, right knee: Secondary | ICD-10-CM | POA: Diagnosis not present

## 2022-04-06 DIAGNOSIS — R5383 Other fatigue: Secondary | ICD-10-CM | POA: Diagnosis not present

## 2022-04-06 MED ORDER — SEMAGLUTIDE-WEIGHT MANAGEMENT 0.25 MG/0.5ML ~~LOC~~ SOAJ: 0.2500 mg | SUBCUTANEOUS | 0 refills | Status: AC

## 2022-04-06 NOTE — Progress Notes (Signed)
Monica Duarte is a 66 y.o. female  ? ?HPI:  Knee Pain: Patient presents with knee pain involving the  right knee. Onset of the symptoms was several years ago. Inciting event: none known. Current symptoms include pain located gen and stiffness. Pain is aggravated by any weight bearing.  Patient has had prior knee problems. Evaluation to date: plain films: DJD .  ? ?She has noted a weight gain of approximately 20 pounds over the last a few month. There is a positive family history for obesity in multiple. She feels ideal weight is 100 pounds lighter. Weight at graduation from high school was unk pounds. History of eating disorders: none. Previous treatments for obesity include: commercial weight loss program: weight watchers, nutritionist consultation, OTC appetite suppressants: "forget", prescription appetite suppressants: phen phen, self-directed dieting, supervised diet program, and surgical procedure: lap band . Associated medical conditions: hypertension, osteoarthritis, and pre dm . Associated medications: none. Cardiovascular risk factors besides obesity: hypertension, obesity (BMI >= 30 kg/m2), and sedentary lifestyle. ? ?The following portions of the patient's history were reviewed and updated as appropriate: allergies, current medications, past family history, past medical history, past social history, past surgical history, and problem list. ? ?Pt feels week with exertion. No SOB or CP. This is not new. Not worsening. Pt trying to sleep better but night time awakening due to urination. She is on a diuretic. ? ?Lap band surgery over 10 yrs ago. No follow-up in a decade. No issues with N/V or abd pain. ? ?Review of Systems ?Pertinent items noted in HPI and remainder of comprehensive ROS otherwise negative. ? ? ? ? ?Past Medical History:  ?Diagnosis Date  ? Asthma   ? Hypertension   ? Obesity   ? ?Past Surgical History:  ?Procedure Laterality Date  ? ectopic    ? LAPAROSCOPIC GASTRIC BANDING    ? 2009   ? ? ?Current Outpatient Medications:  ?  tiZANidine (ZANAFLEX) 4 MG tablet, Take 1 tablet (4 mg total) by mouth at bedtime as needed for muscle spasms., Disp: 10 tablet, Rfl: 0 ?Allergies  ?Allergen Reactions  ? Metformin And Related Other (See Comments)  ?  Mouth soreness  ? Sulfa Antibiotics Other (See Comments)  ?  Gives pt mouth sores  ? ? reports that she has never smoked. She has never used smokeless tobacco. She reports current alcohol use. She reports that she does not use drugs. ?Family History  ?Problem Relation Age of Onset  ? Hypertension Mother   ? Diabetes Mother   ? ?Today's Vitals  ? 04/06/22 1116  ?BP: (!) 135/91  ?Pulse: 84  ?Resp: 12  ?Temp: 97.6 ?F (36.4 ?C)  ?SpO2: 98%  ?Weight: 297 lb 9.6 oz (135 kg)  ? ?Body mass index is 48.03 kg/m?. ? ?Physical Exam ?Vitals and nursing note reviewed.  ?Constitutional:   ?   Appearance: Normal appearance. She is obese. She is not ill-appearing.  ?HENT:  ?   Head: Normocephalic and atraumatic.  ?   Nose: Nose normal.  ?Eyes:  ?   Extraocular Movements: Extraocular movements intact.  ?   Pupils: Pupils are equal, round, and reactive to light.  ?Neck:  ?   Thyroid: No thyroid mass or thyromegaly.  ?Cardiovascular:  ?   Rate and Rhythm: Normal rate.  ?   Pulses: Normal pulses.  ?Musculoskeletal:  ?   Cervical back: Full passive range of motion without pain.  ?   Right knee: Crepitus present. No swelling or effusion. Normal  range of motion.  ?   Left knee: Normal.  ?Skin: ?   Capillary Refill: Capillary refill takes less than 2 seconds.  ?Neurological:  ?   Mental Status: She is alert.  ? ? ? ?Knee: ?Patellar and quadriceps tendons unremarkable. ?Hamstring and quadriceps strength is normal. ? ?Olivene was seen today for knee pain. ? ?Diagnoses and all orders for this visit: ? ?Primary osteoarthritis of right knee ?-     C-reactive protein; Future ? ?Morbid obesity (HCC) ?-     Semaglutide-Weight Management 0.25 MG/0.5ML SOAJ; Inject 0.25 mg into the skin once a week  for 28 days. ?-     Comprehensive Metabolic Panel (CMET); Future ?-     CBC with Differential; Future ?-     Lipid Profile; Future ?-     HgB A1c; Future ? ?Fatigue, unspecified type ?-     Comprehensive Metabolic Panel (CMET); Future ?-     CBC with Differential; Future ?-     HgB A1c; Future ?-     EKG 12-Lead; Future ? ?H/O laparoscopic adjustable gastric banding ? ? ?- weight loss will help with multiple issues pt has ?- re-image knees if pain worsens ?- will review labs when back ?- scripts as above ?- pt making decision about lap band f/u, will discuss soon or with PCP, needs f/u with bariatric clinic ? ?F/u 2-8 weeks, discuss HTN, urinary freq ? ?Haydee Salter, MD ? ? ?

## 2022-04-07 ENCOUNTER — Other Ambulatory Visit: Payer: Self-pay | Admitting: Nurse Practitioner

## 2022-04-07 ENCOUNTER — Other Ambulatory Visit: Payer: Medicaid Other | Admitting: Nurse Practitioner

## 2022-04-07 DIAGNOSIS — R5383 Other fatigue: Secondary | ICD-10-CM

## 2022-04-07 DIAGNOSIS — M1711 Unilateral primary osteoarthritis, right knee: Secondary | ICD-10-CM

## 2022-04-08 LAB — HEMOGLOBIN A1C
Est. average glucose Bld gHb Est-mCnc: 134 mg/dL
Hgb A1c MFr Bld: 6.3 % — ABNORMAL HIGH (ref 4.8–5.6)

## 2022-04-08 LAB — SPECIMEN STATUS REPORT

## 2022-04-08 LAB — COMPREHENSIVE METABOLIC PANEL
ALT: 15 IU/L (ref 0–32)
AST: 16 IU/L (ref 0–40)
Albumin/Globulin Ratio: 1.5 (ref 1.2–2.2)
Albumin: 4.1 g/dL (ref 3.8–4.8)
Alkaline Phosphatase: 93 IU/L (ref 44–121)
BUN/Creatinine Ratio: 11 — ABNORMAL LOW (ref 12–28)
BUN: 10 mg/dL (ref 8–27)
Bilirubin Total: <0.2 mg/dL (ref 0.0–1.2)
CO2: 24 mmol/L (ref 20–29)
Calcium: 9.3 mg/dL (ref 8.7–10.3)
Chloride: 103 mmol/L (ref 96–106)
Creatinine, Ser: 0.88 mg/dL (ref 0.57–1.00)
Globulin, Total: 2.8 g/dL (ref 1.5–4.5)
Glucose: 106 mg/dL — ABNORMAL HIGH (ref 70–99)
Potassium: 4.1 mmol/L (ref 3.5–5.2)
Sodium: 141 mmol/L (ref 134–144)
Total Protein: 6.9 g/dL (ref 6.0–8.5)
eGFR: 74 mL/min/{1.73_m2} (ref >59)

## 2022-04-08 LAB — CBC WITH DIFFERENTIAL/PLATELET
Basophils Absolute: 0 10*3/uL (ref 0.0–0.2)
Basos: 1 %
EOS (ABSOLUTE): 0.2 10*3/uL (ref 0.0–0.4)
Eos: 2 %
Hematocrit: 44.5 % (ref 34.0–46.6)
Hemoglobin: 14.2 g/dL (ref 11.1–15.9)
Immature Grans (Abs): 0 10*3/uL (ref 0.0–0.1)
Immature Granulocytes: 0 %
Lymphocytes Absolute: 2.2 10*3/uL (ref 0.7–3.1)
Lymphs: 32 %
MCH: 25.4 pg — ABNORMAL LOW (ref 26.6–33.0)
MCHC: 31.9 g/dL (ref 31.5–35.7)
MCV: 80 fL (ref 79–97)
Monocytes Absolute: 0.6 10*3/uL (ref 0.1–0.9)
Monocytes: 8 %
Neutrophils Absolute: 3.9 10*3/uL (ref 1.4–7.0)
Neutrophils: 57 %
Platelets: 383 10*3/uL (ref 150–450)
RBC: 5.58 x10E6/uL — ABNORMAL HIGH (ref 3.77–5.28)
RDW: 14.8 % (ref 11.7–15.4)
WBC: 6.9 10*3/uL (ref 3.4–10.8)

## 2022-04-08 LAB — LIPID PANEL
Chol/HDL Ratio: 3 ratio (ref 0.0–4.4)
Cholesterol, Total: 151 mg/dL (ref 100–199)
HDL: 51 mg/dL (ref >39)
LDL Chol Calc (NIH): 87 mg/dL (ref 0–99)
Triglycerides: 65 mg/dL (ref 0–149)
VLDL Cholesterol Cal: 13 mg/dL (ref 5–40)

## 2022-04-10 LAB — CBC WITH DIFFERENTIAL/PLATELET

## 2022-04-10 LAB — SPECIMEN STATUS REPORT

## 2022-04-12 ENCOUNTER — Ambulatory Visit: Payer: Medicaid Other | Admitting: Orthopaedic Surgery

## 2022-04-12 VITALS — Ht 66.0 in | Wt 290.0 lb

## 2022-04-12 DIAGNOSIS — M1711 Unilateral primary osteoarthritis, right knee: Secondary | ICD-10-CM | POA: Diagnosis not present

## 2022-04-12 MED ORDER — BUPIVACAINE HCL 0.25 % IJ SOLN
2.0000 mL | INTRAMUSCULAR | Status: AC | PRN
  Administered 2022-04-12: 2 mL via INTRA_ARTICULAR

## 2022-04-12 MED ORDER — METHYLPREDNISOLONE ACETATE 40 MG/ML IJ SUSP
80.0000 mg | INTRAMUSCULAR | Status: AC | PRN
  Administered 2022-04-12: 80 mg via INTRA_ARTICULAR

## 2022-04-12 MED ORDER — LIDOCAINE HCL 1 % IJ SOLN
2.0000 mL | INTRAMUSCULAR | Status: AC | PRN
  Administered 2022-04-12: 2 mL

## 2022-04-12 NOTE — Progress Notes (Deleted)
? ?Office Visit Note ?  ?Patient: Monica Duarte           ?Date of Birth: Apr 17, 1956           ?MRN: [id number] ?Visit Date: 04/12/2022 ?             ?Requested by: No referring provider defined for this encounter. ?PCP: Haydee Salter, MD ? ?Chief Complaint  ?Patient presents with  ?? Right Knee - Pain  ? ? ? ? ?HPI: ?Patient comes in today complaining of right knee pain.  Has a history of right knee arthritis.  Has had a cortisone injection in the past with good results ? ?Assessment & Plan: ?Visit Diagnoses: Right knee arthritis ? ?Plan: Pleasant 66 year old woman who is seen [place] in the past for right knee pain with a history of arthritis.  At her last visit she did get a cortisone injection which she did not think helped a lot but did not remember how long it lasted.  She comes in today wanting to talk about other options for her right knee.  She has begun working with her primary care provider to lose weight and decrease her BMI which is currently 47.  She understands she would have to weigh about 225 to 230 pounds but then we could go forward with surgery.  Briefly discussed the recovery with knee replacement.  If she did not get long-lasting relief from the cortisone injection could consider gel injections however she would have to wait a bit longer for her knee replacement ? ?Follow-Up Instructions: No follow-ups on file.  ? ?Ortho Exam ? ?Patient is alert, oriented, no adenopathy, well-dressed, normal affect, normal respiratory effort. ?Right knee no effusion no soft tissue swelling tenderness over the medial lateral lateral joint lines and patellofemoral joint.  Distal compartments are intact ? ?Imaging: ?No results found. ?No images are attached to the encounter. ? ?Labs: ?Lab Results  ?Component Value Date  ? HGBA1C 6.3 (H) 04/07/2022  ? HGBA1C 6.2 (H) 08/14/2019  ? HGBA1C 6.3 (H) 02/14/2019  ? ? ? ?Lab Results  ?Component Value Date  ? ALBUMIN 4.1 04/07/2022  ? ALBUMIN 3.8 11/24/2021  ? ALBUMIN  4.3 08/14/2019  ? ? ?No results found for: MG ?No results found for: VD25OH ? ?No results found for: PREALBUMIN ? ?  Latest Ref Rng & Units 04/07/2022  ? 12:00 AM 11/24/2021  ?  6:47 PM 09/21/2018  ? 12:12 PM  ?CBC EXTENDED  ?WBC x10E3/uL CANCELED    ? 6.9   7.7   5.9    ?RBC  CANCELED    ? 5.58   5.76   5.24    ?Hemoglobin  CANCELED    ? 14.2   15.0   14.0    ?HCT  CANCELED    ? 44.5   46.2   41.8    ?Platelets  CANCELED    ? 383   354   356    ?NEUT# 1.4 - 7.0 x10E3/uL 3.9   4.8   3.7    ?Lymph#  CANCELED    ? 2.2   2.1   1.7    ? ? ? ?Body mass index is 46.81 kg/m?. ? ?Orders:  ?No orders of the defined types were placed in this encounter. ? ?No orders of the defined types were placed in this encounter. ? ? ? Procedures: ?Large Joint Inj on 04/12/2022 2:07 PM ?Indications: pain and diagnostic evaluation ?Details: 25 G 1.5 in needle, anteromedial approach ? ?  Arthrogram: No ? ?Medications: 80 mg methylPREDNISolone acetate 40 MG/ML; 2 mL lidocaine 1 %; 2 mL bupivacaine 0.25 % ?Outcome: tolerated well, no immediate complications ?Procedure, treatment alternatives, risks and benefits explained, specific risks discussed. Consent was given by the patient.  ? ? ?Clinical Data: ?No additional findings. ? ?ROS: ? ?All other systems negative, except as noted in the HPI. ?Review of Systems ? ?Objective: ?Vital Signs: Ht 5\' 6"  (1.676 m)   Wt 290 lb (131.5 kg)   LMP 02/09/2014   BMI 46.81 kg/m?  ? ?Specialty Comments:  ?No specialty comments available. ? ?PMFS History: ?Patient Active Problem List  ? Diagnosis Date Noted  ?? LAP-BAND surgery status 04/06/2022  ?? Trigger finger, left middle finger 06/16/2021  ?? Unilateral primary osteoarthritis, right knee 06/16/2021  ?? Obesity   ?? Chronic right-sided low back pain 03/08/2019  ?? Anxiety 09/21/2018  ?? Stress at home 09/21/2018  ?? Eczema 09/21/2018  ?? Arthritis 09/21/2018  ? ?Past Medical History:  ?Diagnosis Date  ?? Asthma   ?? Hypertension   ?? Obesity   ?  ?Family  History  ?Problem Relation Age of Onset  ?? Hypertension Mother   ?? Diabetes Mother   ?  ?Past Surgical History:  ?Procedure Laterality Date  ?? ectopic    ?? LAPAROSCOPIC GASTRIC BANDING    ? 2009  ? ?Social History  ? ?Occupational History  ?? Not on file  ?Tobacco Use  ?? Smoking status: Never  ?? Smokeless tobacco: Never  ?Vaping Use  ?? Vaping Use: Never used  ?Substance and Sexual Activity  ?? Alcohol use: Yes  ?  Comment: wine and liquor, most days  ?? Drug use: No  ?? Sexual activity: Not on file  ? ? ? ? ? ? ?

## 2022-04-12 NOTE — Progress Notes (Signed)
? ?Office Visit Note ?  ?Patient: Monica Duarte           ?Date of Birth: 11/20/1956           ?MRN: [id number] ?Visit Date: 04/12/2022 ?             ?Requested by: No referring provider defined for this encounter. ?PCP: Haydee Salter, MD ? ? ?Assessment & Plan: ?Visit Diagnoses: Right knee arthritis ? ?Plan: Pleasant 66 year old woman with a BMI of 47.  She is last seen several months ago complaining of right knee pain with a history of arthritis.  She was injected with steroid and did quite well.  She is inquiring about what surgery might be helpful.  We discussed with her that she would need to get to a BMI of about 38 and she is actively working with her primary care provider to do this.  She would need to lose approximately 70 pounds.  In the meantime she may get steroid injections as needed ? ?Follow-Up Instructions: No follow-ups on file.  ? ?Orders:  ?No orders of the defined types were placed in this encounter. ? ?No orders of the defined types were placed in this encounter. ? ? ? ? Procedures: ?Large Joint Inj on 04/12/2022 2:10 PM ?Indications: pain and diagnostic evaluation ?Details: 25 G 1.5 in needle, anteromedial approach ? ?Arthrogram: No ? ?Medications: 80 mg methylPREDNISolone acetate 40 MG/ML; 2 mL lidocaine 1 %; 2 mL bupivacaine 0.25 % ?Outcome: tolerated well, no immediate complications ?Procedure, treatment alternatives, risks and benefits explained, specific risks discussed. Consent was given by the patient.  ? ? ? ?Clinical Data: ?No additional findings. ? ? ?Subjective: ?Chief Complaint  ?Patient presents with  ? Right Knee - Pain  ?Patient presents today for chronic right knee pain. She was here last in July of last year and had her knee injected. She would like to get another cortisone injection today. She wants to talk about surgical options today as well. She has been taking tramadol, but states that it does not help anymore. She is not diabetic. ? ?HPI ? ?Review of Systems  ?All  other systems reviewed and are negative. ? ? ?Objective: ?Vital Signs: LMP 02/09/2014  ? ?Physical Exam ?Constitutional:   ?   Appearance: Normal appearance.  ?Pulmonary:  ?   Effort: Pulmonary effort is normal.  ?Skin: ?   General: Skin is warm and dry.  ?Neurological:  ?   Mental Status: She is alert.  ?Psychiatric:     ?   Mood and Affect: Mood normal.     ?   Behavior: Behavior normal.  ? ? ?Ortho Exam ?Examination of her right knee no effusion no redness no induration.  She has tenderness over the medial lateral joint line crepitus with range of motion ?Specialty Comments:  ?No specialty comments available. ? ?Imaging: ?No results found. ? ? ?PMFS History: ?Patient Active Problem List  ? Diagnosis Date Noted  ? LAP-BAND surgery status 04/06/2022  ? Trigger finger, left middle finger 06/16/2021  ? Unilateral primary osteoarthritis, right knee 06/16/2021  ? Obesity   ? Chronic right-sided low back pain 03/08/2019  ? Anxiety 09/21/2018  ? Stress at home 09/21/2018  ? Eczema 09/21/2018  ? Arthritis 09/21/2018  ? ?Past Medical History:  ?Diagnosis Date  ? Asthma   ? Hypertension   ? Obesity   ?  ?Family History  ?Problem Relation Age of Onset  ? Hypertension Mother   ? Diabetes Mother   ?  ?  Past Surgical History:  ?Procedure Laterality Date  ? ectopic    ? LAPAROSCOPIC GASTRIC BANDING    ? 2009  ? ?Social History  ? ?Occupational History  ? Not on file  ?Tobacco Use  ? Smoking status: Never  ? Smokeless tobacco: Never  ?Vaping Use  ? Vaping Use: Never used  ?Substance and Sexual Activity  ? Alcohol use: Yes  ?  Comment: wine and liquor, most days  ? Drug use: No  ? Sexual activity: Not on file  ? ? ? ? ? ? ?

## 2022-04-13 ENCOUNTER — Other Ambulatory Visit: Payer: Self-pay | Admitting: Family Medicine

## 2022-05-02 ENCOUNTER — Ambulatory Visit: Payer: Medicaid Other | Admitting: Family Medicine

## 2022-05-10 ENCOUNTER — Ambulatory Visit: Payer: Medicaid Other | Admitting: Family Medicine

## 2022-10-14 NOTE — Therapy (Incomplete)
OUTPATIENT PHYSICAL THERAPY LOWER EXTREMITY EVALUATION   Patient Name: Monica Duarte MRN: [id number] DOB:1956/03/18, 66 y.o., female Today's Date: 10/14/2022    Past Medical History:  Diagnosis Date   Asthma    Hypertension    Obesity    Past Surgical History:  Procedure Laterality Date   ectopic     LAPAROSCOPIC GASTRIC BANDING     2009   Patient Active Problem List   Diagnosis Date Noted   LAP-BAND surgery status 04/06/2022   Trigger finger, left middle finger 06/16/2021   Unilateral primary osteoarthritis, right knee 06/16/2021   Obesity    Chronic right-sided low back pain 03/08/2019   Anxiety 09/21/2018   Stress at home 09/21/2018   Eczema 09/21/2018   Arthritis 09/21/2018    PCP: Elwin Mocha, MD   REFERRING PROVIDER: Gerlene Burdock, MD   REFERRING DIAG:  Diagnosis  M17.9 (ICD-10-CM) - Osteoarthritis of knee, unspecified    THERAPY DIAG:  No diagnosis found.  Rationale for Evaluation and Treatment: Rehabilitation  ONSET DATE: chronic   SUBJECTIVE:   SUBJECTIVE STATEMENT: ***  PERTINENT HISTORY: Asthma Hypertension Obesity  PAIN:  Are you having pain? Yes: NPRS scale: ***/10 Pain location: *** Pain description: *** Aggravating factors: *** Relieving factors: ***  PRECAUTIONS: {Therapy precautions:24002}  WEIGHT BEARING RESTRICTIONS: No  FALLS:  Has patient fallen in last 6 months? {fallsyesno:27318}  LIVING ENVIRONMENT: Lives with: {[facility] lives with:25569::"lives with their family"} Lives in: {Lives in:25570} Stairs: {opstairs:27293} Has following equipment at home: {Assistive devices:23999}  OCCUPATION: ***  PLOF: {PLOF:24004}  PATIENT GOALS: ***  NEXT MD VISIT:   OBJECTIVE:   DIAGNOSTIC FINDINGS: ***  PATIENT SURVEYS:  {rehab surveys:24030}  COGNITION: Overall cognitive status: {cognition:24006}     SENSATION: {sensation:27233}  EDEMA:  {edema:24020}  MUSCLE LENGTH: Hamstrings: Right *** deg; Left  *** deg Thomas test: Right *** deg; Left *** deg  POSTURE: {posture:25561}  PALPATION: ***  LOWER EXTREMITY ROM:  {AROM/PROM:27142} ROM Right eval Left eval  Hip flexion    Hip extension    Hip abduction    Hip adduction    Hip internal rotation    Hip external rotation    Knee flexion    Knee extension    Ankle dorsiflexion    Ankle plantarflexion    Ankle inversion    Ankle eversion     (Blank rows = not tested)  LOWER EXTREMITY MMT:  MMT Right eval Left eval  Hip flexion    Hip extension    Hip abduction    Hip adduction    Hip internal rotation    Hip external rotation    Knee flexion    Knee extension    Ankle dorsiflexion    Ankle plantarflexion    Ankle inversion    Ankle eversion     (Blank rows = not tested)  LOWER EXTREMITY SPECIAL TESTS:  {LEspecialtests:26242}  FUNCTIONAL TESTS:  {Functional tests:24029}  GAIT: Distance walked: *** Assistive device utilized: {Assistive devices:23999} Level of assistance: {Levels of assistance:24026} Comments: ***   [facility] Adult PT Treatment:                                                DATE: 10/17/22 Therapeutic Exercise: *** Manual Therapy: *** Neuromuscular re-ed: *** Therapeutic Activity: *** Modalities: *** Self Care: ***    PATIENT EDUCATION:  Education details: ***  Person educated: {Person educated:25204} Education method: {Education Method:25205} Education comprehension: {Education Comprehension:25206}  HOME EXERCISE PROGRAM: ***  ASSESSMENT:  CLINICAL IMPRESSION: Patient is a *** y.o. *** who was seen today for physical therapy evaluation and treatment for ***.   OBJECTIVE IMPAIRMENTS: {opptimpairments:25111}.   ACTIVITY LIMITATIONS: {activitylimitations:27494}  PARTICIPATION LIMITATIONS: {participationrestrictions:25113}  PERSONAL FACTORS: {Personal factors:25162} are also affecting patient's functional outcome.   REHAB POTENTIAL: {rehabpotential:25112}  CLINICAL  DECISION MAKING: {clinical decision making:25114}  EVALUATION COMPLEXITY: {Evaluation complexity:25115}   GOALS: Goals reviewed with patient? {yes/no:20286}  SHORT TERM GOALS: Target date: {follow up:25551}  *** Baseline: Goal status: {GOALSTATUS:25110}  2.  *** Baseline:  Goal status: {GOALSTATUS:25110}  3.  *** Baseline:  Goal status: {GOALSTATUS:25110}  4.  *** Baseline:  Goal status: {GOALSTATUS:25110}  5.  *** Baseline:  Goal status: {GOALSTATUS:25110}  6.  *** Baseline:  Goal status: {GOALSTATUS:25110}  LONG TERM GOALS: Target date: {follow up:25551}   *** Baseline:  Goal status: {GOALSTATUS:25110}  2.  *** Baseline:  Goal status: {GOALSTATUS:25110}  3.  *** Baseline:  Goal status: {GOALSTATUS:25110}  4.  *** Baseline:  Goal status: {GOALSTATUS:25110}  5.  *** Baseline:  Goal status: {GOALSTATUS:25110}  6.  *** Baseline:  Goal status: {GOALSTATUS:25110}   PLAN:  PT FREQUENCY: {rehab frequency:25116}  PT DURATION: {rehab duration:25117}  PLANNED INTERVENTIONS: {rehab planned interventions:25118::"Therapeutic exercises","Therapeutic activity","Neuromuscular re-education","Balance training","Gait training","Patient/Family education","Self Care","Joint mobilization"}  PLAN FOR NEXT SESSION: ***   Ennis Delpozo, PT, DPT, ATC 10/14/22 12:11 PM   Check all possible CPT codes: {cptcodes:24818}    Check all conditions that are expected to impact treatment: {Conditions expected to impact treatment:28273}   If treatment provided at initial evaluation, no treatment charged due to lack of authorization.

## 2022-10-17 ENCOUNTER — Ambulatory Visit: Payer: Medicaid Other

## 2022-10-20 ENCOUNTER — Encounter (HOSPITAL_COMMUNITY): Payer: Self-pay

## 2022-10-20 ENCOUNTER — Ambulatory Visit (HOSPITAL_COMMUNITY)
Admission: EM | Admit: 2022-10-20 | Discharge: 2022-10-20 | Disposition: A | Discharge status: Home / Self Care | Payer: Medicaid Other | Attending: Nurse Practitioner | Admitting: Nurse Practitioner

## 2022-10-20 DIAGNOSIS — L03032 Cellulitis of left toe: Secondary | ICD-10-CM | POA: Diagnosis present

## 2022-10-20 MED ORDER — CLINDAMYCIN HCL 300 MG PO CAPS: 300.0000 mg | ORAL_CAPSULE | Freq: Three times a day (TID) | ORAL | 0 refills | Status: DC

## 2022-10-20 NOTE — Discharge Instructions (Addendum)
Take antibiotics as prescribed  Keep the affected area clean. Soak your toe in warm water for 20 minutes, 2-3 times a day. Keep the area dry when you are not soaking it.  Monitor your toe for any signs of infection. Check for: Redness, swelling, or pain. Fluid or blood. Warmth. Pus or a bad smell.

## 2022-10-20 NOTE — ED Triage Notes (Signed)
Pt woke up 2wks ago with pain on the big toe (left) foot causing pain and discomfort mostly at night

## 2022-10-20 NOTE — ED Provider Notes (Signed)
[facility]    CSN: [id number] Arrival date & time: 10/20/22  1218      History   Chief Complaint Chief Complaint  Patient presents with   Toe Pain    HPI [name] is a 66 y.o. female.   History of Present Illness  DAWSYN Monica Duarte is a 66 y.o. female patient that complains of pain to the left 1st toe. Onset of symptoms was 2 weeks ago. Patient describes pain as aching. She denies any injury. Pain severity now is 5 /10. Pain is aggravated by palpation and wearing shoes. Pain is alleviated by rest. Patient denies any numbness, tingling, weakness, loss of sensation, or loss of motion. The patient denies other symptoms. Patient has soaked her foot in warm water which has caused a discoloration just below the base of the nail which is what prompted her to seek care today.        Past Medical History:  Diagnosis Date   Asthma    Hypertension    Obesity     Patient Active Problem List   Diagnosis Date Noted   LAP-BAND surgery status 04/06/2022   Trigger finger, left middle finger 06/16/2021   Unilateral primary osteoarthritis, right knee 06/16/2021   Obesity    Chronic right-sided low back pain 03/08/2019   Anxiety 09/21/2018   Stress at home 09/21/2018   Eczema 09/21/2018   Arthritis 09/21/2018    Past Surgical History:  Procedure Laterality Date   ectopic     LAPAROSCOPIC GASTRIC BANDING     2009    OB History   No obstetric history on file.      Home Medications    Prior to Admission medications   Medication Sig Start Date End Date Taking? Authorizing Provider  clindamycin (CLEOCIN) 300 MG capsule Take 1 capsule (300 mg total) by mouth 3 (three) times daily. 10/20/22  Yes Lurline Idol, FNP  tiZANidine (ZANAFLEX) 4 MG tablet Take 1 tablet (4 mg total) by mouth at bedtime as needed for muscle spasms. 12/22/20   LampteyBritta Mccreedy, MD    Family History Family History  Problem Relation Age of Onset   Hypertension Mother     Diabetes Mother     Social History Social History   Tobacco Use   Smoking status: Never   Smokeless tobacco: Never  Vaping Use   Vaping Use: Never used  Substance Use Topics   Alcohol use: Yes    Comment: wine and liquor, most days   Drug use: No     Allergies   Metformin and related and Sulfa antibiotics   Review of Systems Review of Systems  Constitutional:  Negative for fever.  Musculoskeletal:        Pain to left great toe   Neurological:  Negative for numbness.  All other systems reviewed and are negative.    Physical Exam Triage Vital Signs ED Triage Vitals  Enc Vitals Group     BP 10/20/22 1328 115/75     Pulse Rate 10/20/22 1328 71     Resp 10/20/22 1328 12     Temp 10/20/22 1328 98 F (36.7 C)     Temp Source 10/20/22 1328 Oral     SpO2 10/20/22 1328 98 %     Weight --      Height --      Head Circumference --      Peak Flow --      Pain Score 10/20/22 1326 5  Pain Loc --      Pain Edu? --      Excl. in [place]? --    No data found.  Updated Vital Signs BP 115/75 (BP Location: Left Arm)   Pulse 71   Temp 98 F (36.7 C) (Oral)   Resp 12   LMP 02/09/2014   SpO2 98%   Visual Acuity Right Eye Distance:   Left Eye Distance:   Bilateral Distance:    Right Eye Near:   Left Eye Near:    Bilateral Near:     Physical Exam Vitals reviewed.  Constitutional:      Appearance: Normal appearance.  HENT:     Head: Normocephalic.  Cardiovascular:     Rate and Rhythm: Normal rate.  Pulmonary:     Effort: Pulmonary effort is normal.  Musculoskeletal:        General: Normal range of motion.     Cervical back: Normal range of motion and neck supple.       Feet:  Feet:     Left foot:     Skin integrity: Ulcer present.     Toenail Condition: Left toenails are normal.     Comments: Superficial abscess noted to the proximal nail fold of the left great toe.  Skin:    General: Skin is warm and dry.  Neurological:     General: No focal deficit  present.     Mental Status: She is alert and oriented to person, place, and time.  Psychiatric:        Mood and Affect: Mood normal.        Behavior: Behavior normal.      UC Treatments / Results  Labs (all labs ordered are listed, but only abnormal results are displayed) Labs Reviewed  AEROBIC CULTURE W GRAM STAIN (SUPERFICIAL SPECIMEN)    EKG   Radiology No results found.  Procedures Incision and Drainage  Date/Time: 10/20/2022 2:42 PM  Performed by: Enrique Sack, FNP Authorized by: Enrique Sack, FNP   Consent:    Consent obtained:  Verbal   Consent given by:  Patient   Risks, benefits, and alternatives were discussed: yes     Risks discussed:  Incomplete drainage, pain and infection   Alternatives discussed:  No treatment Universal protocol:    Patient identity confirmed:  Verbally with patient and arm band Location:    Type:  Abscess (paronychia)   Location:  Lower extremity   Lower extremity location:  Toe   Toe location:  L big toe Pre-procedure details:    Skin preparation:  Povidone-iodine Sedation:    Sedation type:  None Anesthesia:    Anesthesia method:  None Procedure type:    Complexity:  Simple Procedure details:    Incision types:  Single straight   Incision depth:  Subcutaneous   Drainage:  Purulent   Drainage amount:  Moderate   Wound treatment:  Wound left open   Packing materials:  None Post-procedure details:    Procedure completion:  Tolerated  (including critical care time)  Medications Ordered in UC Medications - No data to display  Initial Impression / Assessment and Plan / UC Course  I have reviewed the triage vital signs and the nursing notes.  Pertinent labs & imaging results that were available during my care of the patient were reviewed by me and considered in my medical decision making (see chart for details).    66 yo female presenting with a paronychia of the left great toe. Patient  underwent I&D without  difficulty. Wound cultures pending. Patient placed on clindamycin. Warm soaks advised. Discussed wound care, monitoring and indications for follow-up with patient.   Today's evaluation has revealed no signs of a dangerous process. Discussed diagnosis with patient and/or guardian. Patient and/or guardian aware of their diagnosis, possible red flag symptoms to watch out for and need for close follow up. Patient and/or guardian understands verbal and written discharge instructions. Patient and/or guardian comfortable with plan and disposition.  Patient and/or guardian has a clear mental status at this time, good insight into illness (after discussion and teaching) and has clear judgment to make decisions regarding their care  Documentation was completed with the aid of voice recognition software. Transcription may contain typographical errors. Final Clinical Impressions(s) / UC Diagnoses   Final diagnoses:  Paronychia of great toe of left foot     Discharge Instructions      Take antibiotics as prescribed  Keep the affected area clean. Soak your toe in warm water for 20 minutes, 2-3 times a day. Keep the area dry when you are not soaking it.  Monitor your toe for any signs of infection. Check for: Redness, swelling, or pain. Fluid or blood. Warmth. Pus or a bad smell.       ED Prescriptions     Medication Sig Dispense Auth. Provider   clindamycin (CLEOCIN) 300 MG capsule Take 1 capsule (300 mg total) by mouth 3 (three) times daily. 30 capsule Enrique Sack, FNP      PDMP not reviewed this encounter.   Enrique Sack, [place] 10/20/22 1444

## 2022-10-24 ENCOUNTER — Telehealth (HOSPITAL_COMMUNITY): Payer: Self-pay | Admitting: Emergency Medicine

## 2022-10-24 LAB — AEROBIC CULTURE W GRAM STAIN (SUPERFICIAL SPECIMEN)

## 2022-10-24 MED ORDER — CIPROFLOXACIN HCL 750 MG PO TABS: 750.0000 mg | ORAL_TABLET | Freq: Two times a day (BID) | ORAL | 0 refills | Status: AC

## 2022-11-08 ENCOUNTER — Ambulatory Visit: Payer: Medicaid Other | Admitting: Physical Therapy

## 2022-11-10 NOTE — Therapy (Addendum)
OUTPATIENT PHYSICAL THERAPY LOWER EXTREMITY EVALUATION   Patient Name: Monica Duarte MRN: [id number] DOB:1956/05/28, 66 y.o., female Today's Date: 11/11/2022  END OF SESSION:  PT End of Session - 11/11/22 0848     Visit Number 1    Number of Visits 17    Date for PT Re-Evaluation 01/06/23    Authorization Type [place] [employer]    Authorization Time Period auth pending    PT Start Time [phone]   pt late arrival   PT Stop Time 0933    PT Time Calculation (min) 43 min    Activity Tolerance Patient tolerated treatment well;No increased pain    Behavior During Therapy WFL for tasks assessed/performed             Past Medical History:  Diagnosis Date   Asthma    Hypertension    Obesity    Past Surgical History:  Procedure Laterality Date   ectopic     LAPAROSCOPIC GASTRIC BANDING     2009   Patient Active Problem List   Diagnosis Date Noted   LAP-BAND surgery status 04/06/2022   Trigger finger, left middle finger 06/16/2021   Unilateral primary osteoarthritis, right knee 06/16/2021   Obesity    Chronic right-sided low back pain 03/08/2019   Anxiety 09/21/2018   Stress at home 09/21/2018   Eczema 09/21/2018   Arthritis 09/21/2018    PCP: no PCP in chart  REFERRING PROVIDER: Luretha Murphy, MD  REFERRING DIAG: M17.9 (ICD-10-CM) - Osteoarthritis of knee, unspecified  THERAPY DIAG:  Chronic pain of right knee  Stiffness of right knee, not elsewhere classified  Other abnormalities of gait and mobility  Muscle weakness (generalized)  Rationale for Evaluation and Treatment: Rehabilitation  ONSET DATE: Several years, gradual onset  SUBJECTIVE:   SUBJECTIVE STATEMENT: Pt reports gradual onset of R knee several years ago, no MOI. Difficulty walking long periods of time, difficulty with lower body dressing on R side. Walking uphill>downhill. Ascending stairs worse than descending. Occasional dizziness she attributes to medication. Occasional right sided  LE numbness "if I sleep wrong" but improves with movement/repositioning. Occasional swelling in anterior knee with increased activity  PERTINENT HISTORY: Asthma, HTN, BMI >45 Meds: hydrochlorothiazide, ibuprofen, wellbutrin, multivitam PAIN:  Are you having pain: no 0/10 Location: R knee, deep How would you describe your pain? Stiffness. Aching.  Best in past week: 0/10 Worst in past week: 8.5/10 (takes a few minutes to settle) Aggravating factors: sitting >74min, walking community distances (grocery store) Easing factors: injections, heating pad, limited relief from medications   PRECAUTIONS: None  WEIGHT BEARING RESTRICTIONS: No  FALLS:  Has patient fallen in last 6 months? Yes. Number of falls 1 full fall, several near falls. Larey Seat going down sisters steps, pt states knee "twisted" and "threw her off balance". 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  PLOF: Independent  PATIENT GOALS: would like to be able to work again, reduce pain, improve community ambulation    OBJECTIVE:   DIAGNOSTIC FINDINGS: no recent imaging in chart  PATIENT SURVEYS:  LEFS 29/80  COGNITION: Overall cognitive status: Within functional limits for tasks assessed     SENSATION: Light touch intact B LE, mild reduction in L2 on L     PALPATION: TTP R TFL and lateral quad  LOWER EXTREMITY ROM:  Active  Right eval Left eval  Knee flexion 102 112  Knee extension 0 0  Hip internal rotation    Hip external rotation     (Blank rows = not tested)  Comments:  stiffness R LE, nonpainful  LOWER EXTREMITY MMT:    MMT Right eval Left eval  Hip flexion 4 4+  Hip abduction (modified sitting) 5 5  Hip internal rotation    Hip external rotation    Knee flexion 4+ 5  Knee extension 4-p! 4+   (Blank rows = not  tested)  Comments: concordant pain with knee extension  FUNCTIONAL TESTS:  5xSTS: 16sec lowest mat no UE support, mild R knee discomfort, intermittent knee crepitus on RLE   GAIT: Distance walked: within clinic Assistive device utilized: None Level of assistance: Complete Independence Comments: reduced gait speed/cadence, increased lateral weightshifting, no overt instability, mild antalgic gait on R   TODAY'S TREATMENT:                                                                                                                              [facility] Adult PT Treatment:                                                DATE: 11/10/22 Therapeutic Exercise: LAQ x10 cues for control and quad squeeze, education for HEP   PATIENT EDUCATION:  Education details: Pt education on PT impairments, prognosis, and POC. Informed consent. Rationale for interventions, safe/appropriate HEP performance. Education on relevant anatomy/physiology Person educated: Patient Education method: Explanation, Demonstration, Tactile cues, Verbal cues, and Handouts Education comprehension: verbalized understanding, returned demonstration, verbal cues required, tactile cues required, and needs further education    HOME EXERCISE PROGRAM: Access Code: [email or web address] URL: [email or web address] Date: 11/11/2022 Prepared by: Enis Slipper  Exercises - Seated Long Arc Quad  - 1 x daily - 7 x weekly - 3 sets - 10 reps  ASSESSMENT:  CLINICAL IMPRESSION: Patient is a pleasant 66 y.o. woman who was seen today for physical therapy evaluation and treatment for chronic R knee pain. Pt reports difficulty with majority of daily activities and difficulty maintaining work status due to pain - most difficulty w/ standing/walking and stair navigation. On examination pt demonstrates R quad weakness and reduction in knee flexion mobility, TTP throughout lateral R quad and TFL. 5xSTS of 16sec indicative of fall risk (cutoff  score 12-14 sec in most populations) and reduced functional mobility. Pt tolerates HEP well with no increase in pain, cues/education for home performance. Education provided as above. Pt tolerates session well, denies any increase in pain on departure, no adverse events. Pt departs today's session in no acute distress, all voiced questions/concerns addressed appropriately from PT perspective.    OBJECTIVE IMPAIRMENTS: Abnormal gait, decreased activity tolerance, decreased balance, decreased endurance, decreased  mobility, difficulty walking, decreased ROM, decreased strength, hypomobility, impaired sensation, obesity, and pain.   ACTIVITY LIMITATIONS: carrying, lifting, bending, sitting, standing, squatting, sleeping, stairs, transfers, toileting, dressing, and locomotion level  PARTICIPATION LIMITATIONS: meal prep, cleaning, laundry, shopping, community activity, and occupation  PERSONAL FACTORS: Time since onset of injury/illness/exacerbation and 1-2 comorbidities: asthma/HTN  are also affecting patient's functional outcome.   REHAB POTENTIAL: Good  CLINICAL DECISION MAKING: Stable/uncomplicated  EVALUATION COMPLEXITY: Low   GOALS: Goals reviewed with patient? No  SHORT TERM GOALS: Target date: 12/09/2022   Pt will demonstrate appropriate understanding and performance of initially prescribed HEP in order to facilitate improved independence with management of symptoms.  Baseline: HEP provided on eval Goal status: INITIAL   2. Pt will score greater than or equal to 35 on LEFS (MDC 9pts) in order to demonstrate improved perception of function due to symptoms.  Baseline: 29/80  Goal status: INITIAL   LONG TERM GOALS: Target date: 01/06/2023   Pt will score greater than or equal to 46 on LEFS (MDC 9pts) in order to demonstrate improved perception of function due to symptoms.  Baseline: 29/80  Goal status: INITIAL    2.  Pt will demonstrate at least 115 degrees of knee flexion AROM in  order to facilitate improved tolerance to functional movements such as stair navigation and walking up/down inclines.  Baseline: see ROM chart above Goal status: INITIAL  3.  Pt will report/demonstrate ability to perform lower body dressing on RLE with 0pt increase in pain on NPS in order to demonstrate improved ability to perform ADLs.  Baseline: pain/difficulty with lower body dressing Goal status: INITIAL  4.  Pt will be able to perform 5xSTS in less than or equal to 12 sec in order to demonstrate reduced fall risk and improved functional independence (MCID 5xSTS = 2.3 sec). Baseline: 16sec no UE support Goal status: INITIAL  5. Pt will demonstrate/report ability to walk/stand for up to 1 hour with less than 3pt increase in pain in order to demonstrate improved tolerance for community ambulation and work activities.   Baseline: unable to walk community distances without significant pain (up to 8.5/10)  Goal status: INITIAL  6. Pt will be able to safely navigate 3-6 standard stairs without rail use in order to reduce fall risk and improve access to own and family member's home.   Baseline: requires rail - increased time/difficulty, has fallen on stairs  Goal status: INITIAL     PLAN:  PT FREQUENCY: 2x/week  PT DURATION: 8 weeks  PLANNED INTERVENTIONS: Therapeutic exercises, Therapeutic activity, Neuromuscular re-education, Balance training, Gait training, Patient/Family education, Self Care, Joint mobilization, Stair training, Aquatic Therapy, Dry Needling, Cryotherapy, Moist heat, Taping, Manual therapy, and Re-evaluation  PLAN FOR NEXT SESSION: Progress ROM/strengthening exercises as able/appropriate, review/update HEP.    Leeroy Cha PT, DPT 11/11/2022 10:44 AM   Check all possible CPT codes: [phone] - PT Re-evaluation, 97110- Therapeutic Exercise, [phone]- Neuro Re-education, [phone] - Gait Training, [phone] - Manual Therapy, [phone] - Therapeutic Activities, [phone] - Self Care,  and [phone] - Aquatic therapy    Check all conditions that are expected to impact treatment: Musculoskeletal disorders   If treatment provided at initial evaluation, no treatment charged due to lack of authorization.

## 2022-11-11 ENCOUNTER — Encounter: Payer: Self-pay | Admitting: Physical Therapy

## 2022-11-11 ENCOUNTER — Ambulatory Visit: Payer: Medicaid Other | Attending: Family Medicine | Admitting: Physical Therapy

## 2022-11-11 ENCOUNTER — Other Ambulatory Visit: Payer: Self-pay

## 2022-11-11 DIAGNOSIS — R2689 Other abnormalities of gait and mobility: Secondary | ICD-10-CM | POA: Diagnosis present

## 2022-11-11 DIAGNOSIS — M6281 Muscle weakness (generalized): Secondary | ICD-10-CM | POA: Insufficient documentation

## 2022-11-11 DIAGNOSIS — M25561 Pain in right knee: Secondary | ICD-10-CM | POA: Diagnosis present

## 2022-11-11 DIAGNOSIS — G8929 Other chronic pain: Secondary | ICD-10-CM | POA: Diagnosis present

## 2022-11-11 DIAGNOSIS — M25661 Stiffness of right knee, not elsewhere classified: Secondary | ICD-10-CM | POA: Insufficient documentation

## 2022-11-18 NOTE — Therapy (Incomplete)
OUTPATIENT PHYSICAL THERAPY TREATMENT NOTE   Patient Name: Monica Duarte MRN: [id number] DOB:05/04/1956, 66 y.o., female Today's Date: 11/18/2022  PCP: no PCP in chart   REFERRING PROVIDER: Luretha Murphy, MD  END OF SESSION:    Past Medical History:  Diagnosis Date   Asthma    Hypertension    Obesity    Past Surgical History:  Procedure Laterality Date   ectopic     LAPAROSCOPIC GASTRIC BANDING     2009   Patient Active Problem List   Diagnosis Date Noted   LAP-BAND surgery status 04/06/2022   Trigger finger, left middle finger 06/16/2021   Unilateral primary osteoarthritis, right knee 06/16/2021   Obesity    Chronic right-sided low back pain 03/08/2019   Anxiety 09/21/2018   Stress at home 09/21/2018   Eczema 09/21/2018   Arthritis 09/21/2018    REFERRING DIAG: M17.9 (ICD-10-CM) - Osteoarthritis of knee, unspecified   THERAPY DIAG:  No diagnosis found.  Rationale for Evaluation and Treatment Rehabilitation  PERTINENT HISTORY: asthma, HTN  PRECAUTIONS: none (fall hx but denies balance issues, monitor/update as needed)  SUBJECTIVE:                                                                                                                                                                                      SUBJECTIVE STATEMENT:  ***   PAIN:  Are you having pain: no 0/10 Location: R knee, deep How would you describe your pain? Stiffness. Aching.  Best in past week: 0/10 Worst in past week: 8.5/10 (takes a few minutes to settle) Aggravating factors: sitting >32min, walking community distances (grocery store) Easing factors: injections, heating pad, limited relief from medications   OBJECTIVE: (objective measures completed at initial evaluation unless otherwise dated)   DIAGNOSTIC FINDINGS: no recent imaging in chart   PATIENT SURVEYS:  LEFS 29/80   COGNITION: Overall cognitive status: Within functional limits for tasks assessed                          SENSATION: Light touch intact B LE, mild reduction in L2 on L        PALPATION: TTP R TFL and lateral quad   LOWER EXTREMITY ROM:   Active  Right eval Left eval  Knee flexion 102 112  Knee extension 0 0  Hip internal rotation      Hip external rotation       (Blank rows = not tested)   Comments:  stiffness R LE, nonpainful   LOWER EXTREMITY MMT:     MMT Right eval Left eval  Hip flexion 4 4+  Hip  abduction (modified sitting) 5 5  Hip internal rotation      Hip external rotation      Knee flexion 4+ 5  Knee extension 4-p! 4+   (Blank rows = not tested)   Comments: concordant pain with knee extension   FUNCTIONAL TESTS:  5xSTS: 16sec lowest mat no UE support, mild R knee discomfort, intermittent knee crepitus on RLE    GAIT: Distance walked: within clinic Assistive device utilized: None Level of assistance: Complete Independence Comments: reduced gait speed/cadence, increased lateral weightshifting, no overt instability, mild antalgic gait on R     TODAY'S TREATMENT:                                                                                                   [facility] Adult PT Treatment:                                                DATE: 11/21/22 Therapeutic Exercise: *** Manual Therapy: *** Neuromuscular re-ed: *** Therapeutic Activity: *** Modalities: *** Self Care: ***                             [facility] Adult PT Treatment:                                                DATE: 11/10/22 Therapeutic Exercise: LAQ x10 cues for control and quad squeeze, education for HEP     PATIENT EDUCATION:  Education details: Pt education on PT impairments, prognosis, and POC. Informed consent. Rationale for interventions, safe/appropriate HEP performance. Education on relevant anatomy/physiology Person educated: Patient Education method: Explanation, Demonstration, Tactile cues, Verbal cues, and Handouts Education comprehension: verbalized  understanding, returned demonstration, verbal cues required, tactile cues required, and needs further education     HOME EXERCISE PROGRAM: Access Code: [email or web address] URL: [email or web address] Date: 11/11/2022 Prepared by: Fransisco Hertz   Exercises - Seated Long Arc Quad  - 1 x daily - 7 x weekly - 3 sets - 10 reps   ASSESSMENT:   CLINICAL IMPRESSION: 11/18/2022 ***  Eval: Patient is a pleasant 65 y.o. woman who was seen today for physical therapy evaluation and treatment for chronic R knee pain. Pt reports difficulty with majority of daily activities and difficulty maintaining work status due to pain - most difficulty w/ standing/walking and stair navigation. On examination pt demonstrates R quad weakness and reduction in knee flexion mobility, TTP throughout lateral R quad and TFL. 5xSTS of 16sec indicative of fall risk (cutoff score 12-14 sec in most populations) and reduced functional mobility. Pt tolerates HEP well with no increase in pain, cues/education for home performance. Education provided as above. Pt tolerates session well, denies any increase in pain on departure, no adverse events. Pt departs today's session in no acute  distress, all voiced questions/concerns addressed appropriately from PT perspective.     OBJECTIVE IMPAIRMENTS: Abnormal gait, decreased activity tolerance, decreased balance, decreased endurance, decreased mobility, difficulty walking, decreased ROM, decreased strength, hypomobility, impaired sensation, obesity, and pain.    ACTIVITY LIMITATIONS: carrying, lifting, bending, sitting, standing, squatting, sleeping, stairs, transfers, toileting, dressing, and locomotion level   PARTICIPATION LIMITATIONS: meal prep, cleaning, laundry, shopping, community activity, and occupation   PERSONAL FACTORS: Time since onset of injury/illness/exacerbation and 1-2 comorbidities: asthma/HTN  are also affecting patient's functional outcome.    REHAB POTENTIAL: Good    CLINICAL DECISION MAKING: Stable/uncomplicated   EVALUATION COMPLEXITY: Low     GOALS: Goals reviewed with patient? No   SHORT TERM GOALS: Target date: 12/09/2022   Pt will demonstrate appropriate understanding and performance of initially prescribed HEP in order to facilitate improved independence with management of symptoms.  Baseline: HEP provided on eval Goal status: INITIAL    2. Pt will score greater than or equal to 35 on LEFS (MDC 9pts) in order to demonstrate improved perception of function due to symptoms.            Baseline: 29/80            Goal status: INITIAL    LONG TERM GOALS: Target date: 01/06/2023    Pt will score greater than or equal to 46 on LEFS (MDC 9pts) in order to demonstrate improved perception of function due to symptoms.            Baseline: 29/80            Goal status: INITIAL     2.  Pt will demonstrate at least 115 degrees of knee flexion AROM in order to facilitate improved tolerance to functional movements such as stair navigation and walking up/down inclines.  Baseline: see ROM chart above Goal status: INITIAL   3.  Pt will report/demonstrate ability to perform lower body dressing on RLE with 0pt increase in pain on NPS in order to demonstrate improved ability to perform ADLs.  Baseline: pain/difficulty with lower body dressing Goal status: INITIAL   4.  Pt will be able to perform 5xSTS in less than or equal to 12 sec in order to demonstrate reduced fall risk and improved functional independence (MCID 5xSTS = 2.3 sec). Baseline: 16sec no UE support Goal status: INITIAL   5. Pt will demonstrate/report ability to walk/stand for up to 1 hour with less than 3pt increase in pain in order to demonstrate improved tolerance for community ambulation and work activities.             Baseline: unable to walk community distances without significant pain (up to 8.5/10)            Goal status: INITIAL   6. Pt will be able to safely navigate 3-6  standard stairs without rail use in order to reduce fall risk and improve access to own and family member's home.             Baseline: requires rail - increased time/difficulty, has fallen on stairs            Goal status: INITIAL         PLAN:   PT FREQUENCY: 2x/week   PT DURATION: 8 weeks   PLANNED INTERVENTIONS: Therapeutic exercises, Therapeutic activity, Neuromuscular re-education, Balance training, Gait training, Patient/Family education, Self Care, Joint mobilization, Stair training, Aquatic Therapy, Dry Needling, Cryotherapy, Moist heat, Taping, Manual therapy, and Re-evaluation   PLAN FOR  NEXT SESSION: Progress ROM/strengthening exercises as able/appropriate, review/update HEP.    Ashley Murrain PT, DPT 11/18/2022 8:36 AM

## 2022-11-21 ENCOUNTER — Ambulatory Visit: Payer: Medicaid Other | Admitting: Physical Therapy

## 2022-11-23 ENCOUNTER — Ambulatory Visit: Payer: Medicaid Other | Admitting: Physical Therapy

## 2022-11-23 ENCOUNTER — Encounter: Payer: Self-pay | Admitting: Physical Therapy

## 2022-11-23 DIAGNOSIS — M25561 Pain in right knee: Secondary | ICD-10-CM | POA: Diagnosis not present

## 2022-11-23 DIAGNOSIS — M6281 Muscle weakness (generalized): Secondary | ICD-10-CM

## 2022-11-23 DIAGNOSIS — R2689 Other abnormalities of gait and mobility: Secondary | ICD-10-CM

## 2022-11-23 DIAGNOSIS — M25661 Stiffness of right knee, not elsewhere classified: Secondary | ICD-10-CM

## 2022-11-23 DIAGNOSIS — G8929 Other chronic pain: Secondary | ICD-10-CM

## 2022-11-23 NOTE — Therapy (Signed)
OUTPATIENT PHYSICAL THERAPY TREATMENT NOTE   Patient Name: Monica Duarte MRN: [id number] DOB:[birth year], 66 y.o., female Today's Date: 11/23/2022  PCP: no PCP in chart   REFERRING PROVIDER: Luretha MurphyHooker, Neema, MD  END OF SESSION:   PT End of Session - 11/23/22 1330     Visit Number 2    Number of Visits 17    Date for PT Re-Evaluation 01/06/23    Authorization Type [place] [employer]    Authorization Time Period 11/21/22 - 01/19/23    Authorization - Visit Number 1    Authorization - Number of Visits 6    PT Start Time 1331    PT Stop Time 1412    PT Time Calculation (min) 41 min    Activity Tolerance Patient tolerated treatment well;No increased pain    Behavior During Therapy WFL for tasks assessed/performed             Past Medical History:  Diagnosis Date   Asthma    Hypertension    Obesity    Past Surgical History:  Procedure Laterality Date   ectopic     LAPAROSCOPIC GASTRIC BANDING     2009   Patient Active Problem List   Diagnosis Date Noted   LAP-BAND surgery status 04/06/2022   Trigger finger, left middle finger 06/16/2021   Unilateral primary osteoarthritis, right knee 06/16/2021   Obesity    Chronic right-sided low back pain 03/08/2019   Anxiety 09/21/2018   Stress at home 09/21/2018   Eczema 09/21/2018   Arthritis 09/21/2018    REFERRING DIAG: M17.9 (ICD-10-CM) - Osteoarthritis of knee, unspecified   THERAPY DIAG:  Chronic pain of right knee  Stiffness of right knee, not elsewhere classified  Other abnormalities of gait and mobility  Muscle weakness (generalized)  Rationale for Evaluation and Treatment Rehabilitation  PERTINENT HISTORY: Asthma, HTN, BMI >45   PRECAUTIONS: fall risk  PATIENT GOALS: would like to be able to work again, reduce pain, improve community ambulation   SUBJECTIVE:                                                                                                                                                                                       SUBJECTIVE STATEMENT:    Pt arrives w/ 4/10 pain, denies any significant pain or soreness after initial evaluation, continues to feel about the same. Able to do HEP daily without issue.   PAIN:  Are you having pain: 4/10 Location: R knee, deep How would you describe your pain? Stiffness. Aching.  Best in past week: 0/10 Worst in past week: 8.5/10 (takes a few minutes to settle) Aggravating factors: sitting >8520min,  walking community distances (grocery store) Easing factors: injections, heating pad, limited relief from medications    OBJECTIVE: (objective measures completed at initial evaluation unless otherwise dated)   DIAGNOSTIC FINDINGS: no recent imaging in chart   PATIENT SURVEYS:  LEFS 29/80   COGNITION: Overall cognitive status: Within functional limits for tasks assessed                         SENSATION: Light touch intact B LE, mild reduction in L2 on L        PALPATION: TTP R TFL and lateral quad   LOWER EXTREMITY ROM:   Active  Right eval Left eval  Knee flexion 102 112  Knee extension 0 0  Hip internal rotation      Hip external rotation       (Blank rows = not tested)   Comments:  stiffness R LE, nonpainful   LOWER EXTREMITY MMT:     MMT Right eval Left eval  Hip flexion 4 4+  Hip abduction (modified sitting) 5 5  Hip internal rotation      Hip external rotation      Knee flexion 4+ 5  Knee extension 4-p! 4+   (Blank rows = not tested)   Comments: concordant pain with knee extension   FUNCTIONAL TESTS:  5xSTS: 16sec lowest mat no UE support, mild R knee discomfort, intermittent knee crepitus on RLE    GAIT: Distance walked: within clinic Assistive device utilized: None Level of assistance: Complete Independence Comments: reduced gait speed/cadence, increased lateral weightshifting, no overt instability, mild antalgic gait on R     TODAY'S TREATMENT:                                                                                                     [facility] Adult PT Treatment:                                                DATE: 11/23/22 Therapeutic Exercise: Seated knee flexion w/ strap and slider 2x12 cues for pacing and breath control Supine SLR RLE 3x8 cues for full straightening and pacing RLE LAQ 2x10 2.5# ankle weight cues for velocity and pacing STS from lowest mat 2x8 w 5# DB, cues for BOS and mechanics Seated adduction w/ pilates circle 2x10            [facility] Adult PT Treatment:                                                DATE: 11/10/22 Therapeutic Exercise: LAQ x10 cues for control and quad squeeze, education for HEP     PATIENT EDUCATION:  Education details: rationale for interventions, HEP review, activity modification, relevant anatomy/physiology Person educated: Patient Education method: Explanation, Demonstration, Tactile cues, Verbal cues  Education comprehension: verbalized understanding, returned demonstration, verbal cues required, tactile cues required, and needs further education     HOME EXERCISE PROGRAM: Access Code: [email or web address] URL: [email or web address] Date: 11/11/2022 Prepared by: Fransisco Hertz   Exercises - Seated Long Arc Quad  - 1 x daily - 7 x weekly - 3 sets - 10 reps   ASSESSMENT:   CLINICAL IMPRESSION: 11/23/2022 Pt arrives w/ 4/10 pain, good HEP compliance reported. Today progressed for expanded program to target tissue extensibility and LE open and closed chain strengthening. Pt tolerates session quite well and reports improved tolerance to activity with repetition. Cues as above. No adverse events, reports no increase in pain on departure. Pt departs today's session in no acute distress, all voiced questions/concerns addressed appropriately from PT perspective.    Eval - Patient is a pleasant 66 y.o. woman who was seen today for physical therapy evaluation and treatment for chronic R knee pain. Pt reports difficulty with  majority of daily activities and difficulty maintaining work status due to pain - most difficulty w/ standing/walking and stair navigation. On examination pt demonstrates R quad weakness and reduction in knee flexion mobility, TTP throughout lateral R quad and TFL. 5xSTS of 16sec indicative of fall risk (cutoff score 12-14 sec in most populations) and reduced functional mobility. Pt tolerates HEP well with no increase in pain, cues/education for home performance. Education provided as above. Pt tolerates session well, denies any increase in pain on departure, no adverse events. Pt departs today's session in no acute distress, all voiced questions/concerns addressed appropriately from PT perspective.     OBJECTIVE IMPAIRMENTS: Abnormal gait, decreased activity tolerance, decreased balance, decreased endurance, decreased mobility, difficulty walking, decreased ROM, decreased strength, hypomobility, impaired sensation, obesity, and pain.    ACTIVITY LIMITATIONS: carrying, lifting, bending, sitting, standing, squatting, sleeping, stairs, transfers, toileting, dressing, and locomotion level   PARTICIPATION LIMITATIONS: meal prep, cleaning, laundry, shopping, community activity, and occupation   PERSONAL FACTORS: Time since onset of injury/illness/exacerbation and 1-2 comorbidities: asthma/HTN  are also affecting patient's functional outcome.    REHAB POTENTIAL: Good   CLINICAL DECISION MAKING: Stable/uncomplicated   EVALUATION COMPLEXITY: Low     GOALS: Goals reviewed with patient? No   SHORT TERM GOALS: Target date: 12/09/2022   Pt will demonstrate appropriate understanding and performance of initially prescribed HEP in order to facilitate improved independence with management of symptoms.  Baseline: HEP provided on eval Goal status: INITIAL    2. Pt will score greater than or equal to 35 on LEFS (MDC 9pts) in order to demonstrate improved perception of function due to symptoms.             Baseline: 29/80            Goal status: INITIAL    LONG TERM GOALS: Target date: 01/06/2023    Pt will score greater than or equal to 46 on LEFS (MDC 9pts) in order to demonstrate improved perception of function due to symptoms.            Baseline: 29/80            Goal status: INITIAL     2.  Pt will demonstrate at least 115 degrees of knee flexion AROM in order to facilitate improved tolerance to functional movements such as stair navigation and walking up/down inclines.  Baseline: see ROM chart above Goal status: INITIAL   3.  Pt will report/demonstrate ability to perform lower body dressing on RLE with 0pt increase in pain on  NPS in order to demonstrate improved ability to perform ADLs.  Baseline: pain/difficulty with lower body dressing Goal status: INITIAL   4.  Pt will be able to perform 5xSTS in less than or equal to 12 sec in order to demonstrate reduced fall risk and improved functional independence (MCID 5xSTS = 2.3 sec). Baseline: 16sec no UE support Goal status: INITIAL   5. Pt will demonstrate/report ability to walk/stand for up to 1 hour with less than 3pt increase in pain in order to demonstrate improved tolerance for community ambulation and work activities.             Baseline: unable to walk community distances without significant pain (up to 8.5/10)            Goal status: INITIAL   6. Pt will be able to safely navigate 3-6 standard stairs without rail use in order to reduce fall risk and improve access to own and family member's home.             Baseline: requires rail - increased time/difficulty, has fallen on stairs            Goal status: INITIAL         PLAN:   PT FREQUENCY: 2x/week   PT DURATION: 8 weeks   PLANNED INTERVENTIONS: Therapeutic exercises, Therapeutic activity, Neuromuscular re-education, Balance training, Gait training, Patient/Family education, Self Care, Joint mobilization, Stair training, Aquatic Therapy, Dry Needling, Cryotherapy,  Moist heat, Taping, Manual therapy, and Re-evaluation   PLAN FOR NEXT SESSION: Progress ROM/strengthening exercises as able/appropriate, review/update HEP.    Ashley Murrain PT, DPT 11/23/2022 2:15 PM

## 2022-11-25 NOTE — Therapy (Addendum)
OUTPATIENT PHYSICAL THERAPY TREATMENT NOTE + NO VISIT DISCHARGE (see below)   Patient Name: Monica Duarte MRN: [id number] DOB:[birth year], 66 y.o., female Today's Date: 11/28/2022  PCP: no PCP in chart   REFERRING PROVIDER: Gerlene Burdock, MD  END OF SESSION:   PT End of Session - 11/28/22 1336     Visit Number 3    Number of Visits 17    Date for PT Re-Evaluation 01/06/23    Authorization Type [place] [employer]    Authorization Time Period 11/21/22 - 01/19/23    Authorization - Visit Number 2    Authorization - Number of Visits 6    PT Start Time [id number]   pt late arrival   PT Stop Time 1414    PT Time Calculation (min) 35 min    Activity Tolerance Patient tolerated treatment well;No increased pain    Behavior During Therapy WFL for tasks assessed/performed              Past Medical History:  Diagnosis Date   Asthma    Hypertension    Obesity    Past Surgical History:  Procedure Laterality Date   ectopic     LAPAROSCOPIC GASTRIC BANDING     2009   Patient Active Problem List   Diagnosis Date Noted   LAP-BAND surgery status 04/06/2022   Trigger finger, left middle finger 06/16/2021   Unilateral primary osteoarthritis, right knee 06/16/2021   Obesity    Chronic right-sided low back pain 03/08/2019   Anxiety 09/21/2018   Stress at home 09/21/2018   Eczema 09/21/2018   Arthritis 09/21/2018    REFERRING DIAG: M17.9 (ICD-10-CM) - Osteoarthritis of knee, unspecified   THERAPY DIAG:  Chronic pain of right knee  Stiffness of right knee, not elsewhere classified  Other abnormalities of gait and mobility  Muscle weakness (generalized)  Rationale for Evaluation and Treatment Rehabilitation  PERTINENT HISTORY: Asthma, HTN, BMI >45   PRECAUTIONS: fall risk  PATIENT GOALS: would like to be able to work again, reduce pain, improve community ambulation   SUBJECTIVE:                                                                                                                                                                                       SUBJECTIVE STATEMENT:   Pt arrives with report of limited compliance w/ HEP. Denies any significant pain/soreness after last session, does report some back pain that started today.   PAIN:  Are you having pain: 4/10 Location: R knee, deep How would you describe your pain? Stiffness. Aching.  Best in past week: 0/10 Worst in past week: 8.5/10 (takes a few  minutes to settle) Aggravating factors: sitting >76mn, walking community distances (grocery store) Easing factors: injections, heating pad, limited relief from medications    OBJECTIVE: (objective measures completed at initial evaluation unless otherwise dated)   DIAGNOSTIC FINDINGS: no recent imaging in chart   PATIENT SURVEYS:  LEFS 29/80   COGNITION: Overall cognitive status: Within functional limits for tasks assessed                         SENSATION: Light touch intact B LE, mild reduction in L2 on L        PALPATION: TTP R TFL and lateral quad   LOWER EXTREMITY ROM:   Active  Right eval Left eval  Knee flexion 102 112  Knee extension 0 0  Hip internal rotation      Hip external rotation       (Blank rows = not tested)   Comments:  stiffness R LE, nonpainful   LOWER EXTREMITY MMT:     MMT Right eval Left eval  Hip flexion 4 4+  Hip abduction (modified sitting) 5 5  Hip internal rotation      Hip external rotation      Knee flexion 4+ 5  Knee extension 4-p! 4+   (Blank rows = not tested)   Comments: concordant pain with knee extension   FUNCTIONAL TESTS:  5xSTS: 16sec lowest mat no UE support, mild R knee discomfort, intermittent knee crepitus on RLE    GAIT: Distance walked: within clinic Assistive device utilized: None Level of assistance: Complete Independence Comments: reduced gait speed/cadence, increased lateral weightshifting, no overt instability, mild antalgic gait on R     TODAY'S  TREATMENT:                                                                                                    [facility] Adult PT Treatment:                                                DATE: 11/28/22 Therapeutic Exercise: Seated knee flex strap + slider x15 cues for setup and pacing RLE LAQ 2x5# ankle weight 2x15 cues for form and pacing 5# DB STS 2x10 cues for form and pacing 4inch step up RLE leading 3x8, unilat UE support, cues for knee control and velocity B heel raises 2x12 cues for pacing   [facility]                                                [facility]: [facility] Therapeutic Exercise: Seated knee flexion w/ strap and slider 2x12 cues for pacing and breath control Supine SLR RLE 3x8 cues for full straightening and pacing RLE LAQ 2x10 2.5# ankle weight cues for velocity and pacing STS from lowest mat 2x8 w 5#  DB, cues for BOS and mechanics Seated adduction w/ pilates circle 2x10            [facility] Adult PT Treatment:                                                DATE: 11/10/22 Therapeutic Exercise: LAQ x10 cues for control and quad squeeze, education for HEP     PATIENT EDUCATION:  Education details: rationale for interventions, HEP review/update and appropriate performance, activity modification, relevant anatomy/physiology Person educated: Patient Education method: Explanation, Demonstration, Tactile cues, Verbal cues Education comprehension: verbalized understanding, returned demonstration, verbal cues required, tactile cues required, and needs further education     HOME EXERCISE PROGRAM: Access Code: [email or web address] URL: [email or web address] Date: 11/28/2022 Prepared by: Enis Slipper  Exercises - Seated Long Arc Quad  - 1 x daily - 7 x weekly - 3 sets - 10 reps - Sit to Stand with Armchair  - 1 x daily - 7 x weekly - 2 sets - 10 reps   ASSESSMENT:   CLINICAL IMPRESSION: 11/28/2022 Pt arrives with baseline pain, reports reduced compliance w/ HEP over  weekend. Session shortened due to late arrival but able to progress for increased repetition w/ open chain quad strengthening, incorporation of closed chain LE strengthening. Pt tolerates session well without increase in pain, cues as above. No adverse events, reports improved symptoms on departure. HEP updated as above, pt verbalizes good understanding.  Pt departs today's session in no acute distress, all voiced questions/concerns addressed appropriately from PT perspective.     Eval - Patient is a pleasant 66 y.o. woman who was seen today for physical therapy evaluation and treatment for chronic R knee pain. Pt reports difficulty with majority of daily activities and difficulty maintaining work status due to pain - most difficulty w/ standing/walking and stair navigation. On examination pt demonstrates R quad weakness and reduction in knee flexion mobility, TTP throughout lateral R quad and TFL. 5xSTS of 16sec indicative of fall risk (cutoff score 12-14 sec in most populations) and reduced functional mobility. Pt tolerates HEP well with no increase in pain, cues/education for home performance. Education provided as above. Pt tolerates session well, denies any increase in pain on departure, no adverse events. Pt departs today's session in no acute distress, all voiced questions/concerns addressed appropriately from PT perspective.     OBJECTIVE IMPAIRMENTS: Abnormal gait, decreased activity tolerance, decreased balance, decreased endurance, decreased mobility, difficulty walking, decreased ROM, decreased strength, hypomobility, impaired sensation, obesity, and pain.    ACTIVITY LIMITATIONS: carrying, lifting, bending, sitting, standing, squatting, sleeping, stairs, transfers, toileting, dressing, and locomotion level   PARTICIPATION LIMITATIONS: meal prep, cleaning, laundry, shopping, community activity, and occupation   PERSONAL FACTORS: Time since onset of injury/illness/exacerbation and 1-2  comorbidities: asthma/HTN  are also affecting patient's functional outcome.    REHAB POTENTIAL: Good   CLINICAL DECISION MAKING: Stable/uncomplicated   EVALUATION COMPLEXITY: Low     GOALS: Goals reviewed with patient? No   SHORT TERM GOALS: Target date: 12/09/2022   Pt will demonstrate appropriate understanding and performance of initially prescribed HEP in order to facilitate improved independence with management of symptoms.  Baseline: HEP provided on eval Goal status: INITIAL    2. Pt will score greater than or equal to 35 on LEFS (MDC 9pts) in order to demonstrate improved perception of function due to  symptoms.            Baseline: 29/80            Goal status: INITIAL    LONG TERM GOALS: Target date: 01/06/2023    Pt will score greater than or equal to 46 on LEFS (MDC 9pts) in order to demonstrate improved perception of function due to symptoms.            Baseline: 29/80            Goal status: INITIAL     2.  Pt will demonstrate at least 115 degrees of knee flexion AROM in order to facilitate improved tolerance to functional movements such as stair navigation and walking up/down inclines.  Baseline: see ROM chart above Goal status: INITIAL   3.  Pt will report/demonstrate ability to perform lower body dressing on RLE with 0pt increase in pain on NPS in order to demonstrate improved ability to perform ADLs.  Baseline: pain/difficulty with lower body dressing Goal status: INITIAL   4.  Pt will be able to perform 5xSTS in less than or equal to 12 sec in order to demonstrate reduced fall risk and improved functional independence (MCID 5xSTS = 2.3 sec). Baseline: 16sec no UE support Goal status: INITIAL   5. Pt will demonstrate/report ability to walk/stand for up to 1 hour with less than 3pt increase in pain in order to demonstrate improved tolerance for community ambulation and work activities.             Baseline: unable to walk community distances without significant  pain (up to 8.5/10)            Goal status: INITIAL   6. Pt will be able to safely navigate 3-6 standard stairs without rail use in order to reduce fall risk and improve access to own and family member's home.             Baseline: requires rail - increased time/difficulty, has fallen on stairs            Goal status: INITIAL         PLAN:   PT FREQUENCY: 2x/week   PT DURATION: 8 weeks   PLANNED INTERVENTIONS: Therapeutic exercises, Therapeutic activity, Neuromuscular re-education, Balance training, Gait training, Patient/Family education, Self Care, Joint mobilization, Stair training, Aquatic Therapy, Dry Needling, Cryotherapy, Moist heat, Taping, Manual therapy, and Re-evaluation   PLAN FOR NEXT SESSION:  Progress ROM/strengthening exercises as able/appropriate, review/update HEP.    Leeroy Cha PT, DPT 11/28/2022 2:17 PM    Addendum:   PHYSICAL THERAPY DISCHARGE SUMMARY  Visits from Start of Care: 3  Current functional level related to goals / functional outcomes: Unable to assess   Remaining deficits: Unknown - see above for most recent assessment   Education / Equipment: Unable to assess    Patient unable to agree to discharge due to lack of follow up. Patient goals were  unable to be assessed . Patient is being discharged due to not returning since the last visit.   Leeroy Cha PT, DPT 02/14/2023 12:09 PM

## 2022-11-28 ENCOUNTER — Encounter: Payer: Self-pay | Admitting: Physical Therapy

## 2022-11-28 ENCOUNTER — Ambulatory Visit: Payer: Medicaid Other | Admitting: Physical Therapy

## 2022-11-28 DIAGNOSIS — M25561 Pain in right knee: Secondary | ICD-10-CM | POA: Diagnosis not present

## 2022-11-28 DIAGNOSIS — M6281 Muscle weakness (generalized): Secondary | ICD-10-CM

## 2022-11-28 DIAGNOSIS — G8929 Other chronic pain: Secondary | ICD-10-CM

## 2022-11-28 DIAGNOSIS — R2689 Other abnormalities of gait and mobility: Secondary | ICD-10-CM

## 2022-11-28 DIAGNOSIS — M25661 Stiffness of right knee, not elsewhere classified: Secondary | ICD-10-CM

## 2022-11-29 NOTE — Therapy (Incomplete)
OUTPATIENT PHYSICAL THERAPY TREATMENT NOTE   Patient Name: Monica Duarte MRN: [id number] DOB:Oct 09, 1956, 66 y.o., female Today's Date: 11/29/2022  PCP: no PCP in chart   REFERRING PROVIDER: Luretha Murphy, MD  END OF SESSION:      Past Medical History:  Diagnosis Date   Asthma    Hypertension    Obesity    Past Surgical History:  Procedure Laterality Date   ectopic     LAPAROSCOPIC GASTRIC BANDING     2009   Patient Active Problem List   Diagnosis Date Noted   LAP-BAND surgery status 04/06/2022   Trigger finger, left middle finger 06/16/2021   Unilateral primary osteoarthritis, right knee 06/16/2021   Obesity    Chronic right-sided low back pain 03/08/2019   Anxiety 09/21/2018   Stress at home 09/21/2018   Eczema 09/21/2018   Arthritis 09/21/2018    REFERRING DIAG: M17.9 (ICD-10-CM) - Osteoarthritis of knee, unspecified   THERAPY DIAG:  No diagnosis found.  Rationale for Evaluation and Treatment Rehabilitation  PERTINENT HISTORY: Asthma, HTN, BMI >45   PRECAUTIONS: fall risk  PATIENT GOALS: would like to be able to work again, reduce pain, improve community ambulation   SUBJECTIVE:                                                                                                                                                                                      SUBJECTIVE STATEMENT:   ***  *** Pt arrives with report of limited compliance w/ HEP. Denies any significant pain/soreness after last session, does report some back pain that started today.   PAIN:  Are you having pain: 4/10 Location: R knee, deep How would you describe your pain? Stiffness. Aching.  Best in past week: 0/10 Worst in past week: 8.5/10 (takes a few minutes to settle) Aggravating factors: sitting >60min, walking community distances (grocery store) Easing factors: injections, heating pad, limited relief from medications    OBJECTIVE: (objective measures completed at  initial evaluation unless otherwise dated)   DIAGNOSTIC FINDINGS: no recent imaging in chart   PATIENT SURVEYS:  LEFS 29/80   COGNITION: Overall cognitive status: Within functional limits for tasks assessed                         SENSATION: Light touch intact B LE, mild reduction in L2 on L        PALPATION: TTP R TFL and lateral quad   LOWER EXTREMITY ROM:   Active  Right eval Left eval  Knee flexion 102 112  Knee extension 0 0  Hip internal rotation      Hip external  rotation       (Blank rows = not tested)   Comments:  stiffness R LE, nonpainful   LOWER EXTREMITY MMT:     MMT Right eval Left eval  Hip flexion 4 4+  Hip abduction (modified sitting) 5 5  Hip internal rotation      Hip external rotation      Knee flexion 4+ 5  Knee extension 4-p! 4+   (Blank rows = not tested)   Comments: concordant pain with knee extension   FUNCTIONAL TESTS:  5xSTS: 16sec lowest mat no UE support, mild R knee discomfort, intermittent knee crepitus on RLE    GAIT: Distance walked: within clinic Assistive device utilized: None Level of assistance: Complete Independence Comments: reduced gait speed/cadence, increased lateral weightshifting, no overt instability, mild antalgic gait on R     TODAY'S TREATMENT:                                                                                                 [facility] Adult PT Treatment:                                                DATE: 11/30/22 Therapeutic Exercise: *** Manual Therapy: *** Neuromuscular re-ed: *** Therapeutic Activity: *** Modalities: *** Self Care: ***    [facility] Adult PT Treatment:                                                DATE: 11/28/22 Therapeutic Exercise: Seated knee flex strap + slider x15 cues for setup and pacing RLE LAQ 2x5# ankle weight 2x15 cues for form and pacing 5# DB STS 2x10 cues for form and pacing 4inch step up RLE leading 3x8, unilat UE support, cues for knee control and  velocity B heel raises 2x12 cues for pacing   [facility] Adult PT Treatment:                                                DATE: 11/23/22 Therapeutic Exercise: Seated knee flexion w/ strap and slider 2x12 cues for pacing and breath control Supine SLR RLE 3x8 cues for full straightening and pacing RLE LAQ 2x10 2.5# ankle weight cues for velocity and pacing STS from lowest mat 2x8 w 5# DB, cues for BOS and mechanics Seated adduction w/ pilates circle 2x10     PATIENT EDUCATION:  Education details: rationale for interventions, HEP review/update and appropriate performance, activity modification, relevant anatomy/physiology Person educated: Patient Education method: Explanation, Demonstration, Tactile cues, Verbal cues Education comprehension: verbalized understanding, returned demonstration, verbal cues required, tactile cues required, and needs further education     HOME EXERCISE PROGRAM: Access Code: [email or web address] URL: [email or web address] Date: 11/28/2022 Prepared by:  Enis Slipper  Exercises - Seated Long Arc Quad  - 1 x daily - 7 x weekly - 3 sets - 10 reps - Sit to Stand with Armchair  - 1 x daily - 7 x weekly - 2 sets - 10 reps   ASSESSMENT:   CLINICAL IMPRESSION: 11/29/2022 ***  *** Pt arrives with baseline pain, reports reduced compliance w/ HEP over weekend. Session shortened due to late arrival but able to progress for increased repetition w/ open chain quad strengthening, incorporation of closed chain LE strengthening. Pt tolerates session well without increase in pain, cues as above. No adverse events, reports improved symptoms on departure. HEP updated as above, pt verbalizes good understanding.  Pt departs today's session in no acute distress, all voiced questions/concerns addressed appropriately from PT perspective.     Eval - Patient is a pleasant 66 y.o. woman who was seen today for physical therapy evaluation and treatment for chronic R knee pain. Pt reports  difficulty with majority of daily activities and difficulty maintaining work status due to pain - most difficulty w/ standing/walking and stair navigation. On examination pt demonstrates R quad weakness and reduction in knee flexion mobility, TTP throughout lateral R quad and TFL. 5xSTS of 16sec indicative of fall risk (cutoff score 12-14 sec in most populations) and reduced functional mobility. Pt tolerates HEP well with no increase in pain, cues/education for home performance. Education provided as above. Pt tolerates session well, denies any increase in pain on departure, no adverse events. Pt departs today's session in no acute distress, all voiced questions/concerns addressed appropriately from PT perspective.     OBJECTIVE IMPAIRMENTS: Abnormal gait, decreased activity tolerance, decreased balance, decreased endurance, decreased mobility, difficulty walking, decreased ROM, decreased strength, hypomobility, impaired sensation, obesity, and pain.    ACTIVITY LIMITATIONS: carrying, lifting, bending, sitting, standing, squatting, sleeping, stairs, transfers, toileting, dressing, and locomotion level   PARTICIPATION LIMITATIONS: meal prep, cleaning, laundry, shopping, community activity, and occupation   PERSONAL FACTORS: Time since onset of injury/illness/exacerbation and 1-2 comorbidities: asthma/HTN  are also affecting patient's functional outcome.    REHAB POTENTIAL: Good   CLINICAL DECISION MAKING: Stable/uncomplicated   EVALUATION COMPLEXITY: Low     GOALS: Goals reviewed with patient? No   SHORT TERM GOALS: Target date: 12/09/2022   Pt will demonstrate appropriate understanding and performance of initially prescribed HEP in order to facilitate improved independence with management of symptoms.  Baseline: HEP provided on eval Goal status: INITIAL    2. Pt will score greater than or equal to 35 on LEFS (MDC 9pts) in order to demonstrate improved perception of function due to  symptoms.            Baseline: 29/80            Goal status: INITIAL    LONG TERM GOALS: Target date: 01/06/2023    Pt will score greater than or equal to 46 on LEFS (MDC 9pts) in order to demonstrate improved perception of function due to symptoms.            Baseline: 29/80            Goal status: INITIAL     2.  Pt will demonstrate at least 115 degrees of knee flexion AROM in order to facilitate improved tolerance to functional movements such as stair navigation and walking up/down inclines.  Baseline: see ROM chart above Goal status: INITIAL   3.  Pt will report/demonstrate ability to perform lower body dressing on RLE with  0pt increase in pain on NPS in order to demonstrate improved ability to perform ADLs.  Baseline: pain/difficulty with lower body dressing Goal status: INITIAL   4.  Pt will be able to perform 5xSTS in less than or equal to 12 sec in order to demonstrate reduced fall risk and improved functional independence (MCID 5xSTS = 2.3 sec). Baseline: 16sec no UE support Goal status: INITIAL   5. Pt will demonstrate/report ability to walk/stand for up to 1 hour with less than 3pt increase in pain in order to demonstrate improved tolerance for community ambulation and work activities.             Baseline: unable to walk community distances without significant pain (up to 8.5/10)            Goal status: INITIAL   6. Pt will be able to safely navigate 3-6 standard stairs without rail use in order to reduce fall risk and improve access to own and family member's home.             Baseline: requires rail - increased time/difficulty, has fallen on stairs            Goal status: INITIAL         PLAN:   PT FREQUENCY: 2x/week   PT DURATION: 8 weeks   PLANNED INTERVENTIONS: Therapeutic exercises, Therapeutic activity, Neuromuscular re-education, Balance training, Gait training, Patient/Family education, Self Care, Joint mobilization, Stair training, Aquatic Therapy, Dry  Needling, Cryotherapy, Moist heat, Taping, Manual therapy, and Re-evaluation   PLAN FOR NEXT SESSION: *** Progress ROM/strengthening exercises as able/appropriate, review/update HEP.    Leeroy Cha PT, DPT 11/29/2022 1:02 PM

## 2022-11-30 ENCOUNTER — Ambulatory Visit: Payer: Medicaid Other | Admitting: Physical Therapy

## 2023-01-16 ENCOUNTER — Encounter: Payer: Self-pay | Admitting: Gastroenterology

## 2023-01-26 ENCOUNTER — Ambulatory Visit (AMBULATORY_SURGERY_CENTER): Payer: Medicaid Other

## 2023-01-26 VITALS — Ht 66.0 in | Wt 285.0 lb

## 2023-01-26 DIAGNOSIS — Z1211 Encounter for screening for malignant neoplasm of colon: Secondary | ICD-10-CM

## 2023-01-26 MED ORDER — PEG 3350-KCL-NA BICARB-NACL 420 G PO SOLR: 4000.0000 mL | Freq: Once | ORAL | 0 refills | Status: AC

## 2023-01-26 NOTE — Progress Notes (Signed)
No egg or soy allergy known to patient  No issues known to pt with past sedation with any surgeries or procedures Patient denies ever being told they had issues or difficulty with intubation  No FH of Malignant Hyperthermia Pt is not on diet pills Pt is not on  home 02  Pt is not on blood thinners  Pt denies issues with constipation  No A fib or A flutter Have any cardiac testing pending-- NO Pt instructed to use Singlecare.com or GoodRx for a price reduction on prep   Patient's chart reviewed by Osvaldo Angst CNRA prior to previsit and patient appropriate for the [facility].  Previsit completed and red dot placed by patient's name on their procedure day (on provider's schedule).

## 2023-02-23 ENCOUNTER — Encounter: Payer: Self-pay | Admitting: Gastroenterology

## 2023-02-23 ENCOUNTER — Ambulatory Visit (AMBULATORY_SURGERY_CENTER): Payer: Medicaid Other | Admitting: Gastroenterology

## 2023-02-23 VITALS — BP 112/60 | HR 77 | Temp 98.6°F | Resp 28 | Ht 66.0 in | Wt 285.0 lb

## 2023-02-23 DIAGNOSIS — D123 Benign neoplasm of transverse colon: Secondary | ICD-10-CM

## 2023-02-23 DIAGNOSIS — Z1211 Encounter for screening for malignant neoplasm of colon: Secondary | ICD-10-CM | POA: Diagnosis not present

## 2023-02-23 DIAGNOSIS — K635 Polyp of colon: Secondary | ICD-10-CM

## 2023-02-23 MED ORDER — SODIUM CHLORIDE 0.9 % IV SOLN: 500.0000 mL | Freq: Once | INTRAVENOUS | Status: DC

## 2023-02-23 NOTE — Op Note (Signed)
[place] Patient Name: Monica Duarte Procedure Date: 02/23/2023 10:59 AM MRN: [id number] Endoscopist: Thornton Park MD, MD, [id number] Age: 67 Referring MD:  Date of Birth: 1956/10/13 Gender: Female Account #: [id number] Procedure:                Colonoscopy Indications:              Screening for colorectal malignant neoplasm, This                            is the patient's first colonoscopy                           Father with colon polyps (histology unknown) Medicines:                Monitored Anesthesia Care Procedure:                Pre-Anesthesia Assessment:                           - Prior to the procedure, a History and Physical                            was performed, and patient medications and                            allergies were reviewed. The patient's tolerance of                            previous anesthesia was also reviewed. The risks                            and benefits of the procedure and the sedation                            options and risks were discussed with the patient.                            All questions were answered, and informed consent                            was obtained. Prior Anticoagulants: The patient has                            taken no anticoagulant or antiplatelet agents. ASA                            Grade Assessment: II - A patient with mild systemic                            disease. After reviewing the risks and benefits,                            the patient was deemed in satisfactory condition to  undergo the procedure.                           After obtaining informed consent, the colonoscope                            was passed under direct vision. Throughout the                            procedure, the patient's blood pressure, pulse, and                            oxygen saturations were monitored continuously. The                            Olympus CF-HQ190L  SN [id number] was introduced through                            the anus and advanced to the 3 cm into the ileum. A                            second forward view of the right colon was                            performed. The colonoscopy was performed without                            difficulty. The patient tolerated the procedure                            well. The quality of the bowel preparation was                            good. The terminal ileum, ileocecal valve,                            appendiceal orifice, and rectum were photographed. Scope In: 11:10:54 AM Scope Out: 11:26:11 AM Scope Withdrawal Time: 0 hours 11 minutes 32 seconds  Total Procedure Duration: 0 hours 15 minutes 17 seconds  Findings:                 The perianal and digital rectal examinations were                            normal.                           Multiple medium-mouthed and small-mouthed                            diverticula were found in the sigmoid colon and                            descending colon.  A 3 mm polyp was found in the transverse colon. The                            polyp was flat. The polyp was removed with a cold                            snare. Resection and retrieval were complete.                            Estimated blood loss was minimal.                           The exam was otherwise without abnormality on                            direct and retroflexion views except for small                            internal hemorrhoids. Complications:            No immediate complications. Estimated Blood Loss:     Estimated blood loss was minimal. Impression:               - Diverticulosis in the sigmoid colon and in the                            descending colon.                           - One 3 mm polyp in the transverse colon, removed                            with a cold snare. Resected and retrieved.                           - The examination  was otherwise normal on direct                            and retroflexion views. Recommendation:           - Patient has a contact number available for                            emergencies. The signs and symptoms of potential                            delayed complications were discussed with the                            patient. Return to normal activities tomorrow.                            Written discharge instructions were provided to the  patient.                           - High fiber diet.                           - Continue present medications.                           - Await pathology results.                           - Repeat colonoscopy in 5 years for surveillance                            regardless of pathology given the family history.                           - Follow a high fiber diet. Drink at least 64                            ounces of water daily. Add a daily stool bulking                            agent such as psyllium (an exampled would be                            Metamucil).                           - Emerging evidence supports eating a diet of                            fruits, vegetables, grains, calcium, and yogurt                            while reducing red meat and alcohol may reduce the                            risk of colon cancer.                           - Thank you for allowing me to be involved in your                            colon cancer prevention. Thornton Park MD, MD 02/23/2023 11:31:33 AM This report has been signed electronically.

## 2023-02-23 NOTE — Progress Notes (Signed)
Report to PACU, RN, vss, BBS= Clear.  

## 2023-02-23 NOTE — Patient Instructions (Addendum)
HIGH FIBER DIET CONTINUE PRESENT MEDICATIONS AWAIT PATHOLOGY RESULTS REPEAT COLONOSCOPY IN 5 YEARS ADD A DAILY STOOL BULKING AGENT SUCH AS PSYLLIUM(METAMUCIL) DRINK AT LEAST 64 OUNCES OF WATER DAILY HANDOUTS/INFORMATION GIVEN FOR POLYPS, DIVERTICULOSIS AND HIGH FIBER DIET  YOU HAD AN ENDOSCOPIC PROCEDURE TODAY AT THE [facility]:   Refer to the procedure report that was given to you for any specific questions about what was found during the examination.  If the procedure report does not answer your questions, please call your gastroenterologist to clarify.  If you requested that your care partner not be given the details of your procedure findings, then the procedure report has been included in a sealed envelope for you to review at your convenience later.  YOU SHOULD EXPECT: Some feelings of bloating in the abdomen. Passage of more gas than usual.  Walking can help get rid of the air that was put into your GI tract during the procedure and reduce the bloating. If you had a lower endoscopy (such as a colonoscopy or flexible sigmoidoscopy) you may notice spotting of blood in your stool or on the toilet paper. If you underwent a bowel prep for your procedure, you may not have a normal bowel movement for a few days.  Please Note:  You might notice some irritation and congestion in your nose or some drainage.  This is from the oxygen used during your procedure.  There is no need for concern and it should clear up in a day or so.  SYMPTOMS TO REPORT IMMEDIATELY:  Following lower endoscopy (colonoscopy):  Excessive amounts of blood in the stool  Significant tenderness or worsening of abdominal pains  Swelling of the abdomen that is new, acute  Fever of 100F or higher  For urgent or emergent issues, a gastroenterologist can be reached at any hour by calling [phone]. Do not use MyChart messaging for urgent concerns.   DIET:  We do recommend a small meal at first, but then you may  proceed to your regular diet.  Drink plenty of fluids but you should avoid alcoholic beverages for 24 hours.  ACTIVITY:  You should plan to take it easy for the rest of today and you should NOT DRIVE or use heavy machinery until tomorrow (because of the sedation medicines used during the test).    FOLLOW UP: Our staff will call the number listed on your records the next business day following your procedure.  We will call around 7:15- 8:00 am to check on you and address any questions or concerns that you may have regarding the information given to you following your procedure. If we do not reach you, we will leave a message.     If any biopsies were taken you will be contacted by phone or by letter within the next 1-3 weeks.  Please call us at [phone] if you have not heard about the biopsies in 3 weeks.    SIGNATURES/CONFIDENTIALITY: You and/or your care partner have signed paperwork which will be entered into your electronic medical record.  These signatures attest to the fact that that the information above on your After Visit Summary has been reviewed and is understood.  Full responsibility of the confidentiality of this discharge information lies with you and/or your care-partner.

## 2023-02-23 NOTE — Progress Notes (Signed)
   Referring Provider: Thornton Park, MD Primary Care Physician:  Pcp, No  Indication for Colonoscopy:  Colon cancer screening   IMPRESSION:  Need for colon cancer screening Appropriate candidate for monitored anesthesia care  PLAN: Colonoscopy in the [place] today   HPI: Monica Duarte is a 67 y.o. female presents for screening colonoscopy.  Prior colonoscopy 2009.   No known family history of colon cancer or polyps. No family history of uterine/endometrial cancer, pancreatic cancer or gastric/stomach cancer.   Past Medical History:  Diagnosis Date   Anxiety    Arthritis    Asthma    Depression    occasional   Diabetes mellitus without complication ([place])    GERD (gastroesophageal reflux disease)    Hypertension    Obesity     Past Surgical History:  Procedure Laterality Date   ectopic     LAPAROSCOPIC GASTRIC BANDING     2009   LEEP     SALPINGECTOMY      Current Outpatient Medications  Medication Sig Dispense Refill   ACCU-CHEK GUIDE test strip      Accu-Chek Softclix Lancets lancets SMARTSIG:Topical     Blood Glucose Monitoring Suppl (ACCU-CHEK GUIDE) w/Device KIT See admin instructions.     buPROPion (WELLBUTRIN XL) 150 MG 24 hr tablet      hydrochlorothiazide (HYDRODIURIL) 25 MG tablet Take 25 mg by mouth daily.     ibuprofen (ADVIL) 600 MG tablet Take 600 mg by mouth 3 (three) times daily as needed. (Patient not taking: Reported on 01/26/2023)     JANUVIA 25 MG tablet Take 25 mg by mouth daily.     meloxicam (MOBIC) 15 MG tablet Take 15 mg by mouth daily.     mupirocin ointment (BACTROBAN) 2 % Apply topically. (Patient not taking: Reported on 01/26/2023)     terbinafine (LAMISIL) 250 MG tablet Take 250 mg by mouth daily.     Current Facility-Administered Medications  Medication Dose Route Frequency Provider Last Rate Last Admin   0.9 %  sodium chloride infusion  500 mL Intravenous Once Thornton Park, MD        Allergies as of 02/23/2023 -  Review Complete 02/23/2023  Allergen Reaction Noted   Metformin and related Other (See Comments) 05/25/2019   Sulfa antibiotics Other (See Comments) 01/16/2012    Family History  Problem Relation Age of Onset   Hypertension Mother    Diabetes Mother    Colon cancer Neg Hx    Colon polyps Neg Hx    Esophageal cancer Neg Hx    Rectal cancer Neg Hx    Stomach cancer Neg Hx      Physical Exam: General:   Alert,  well-nourished, pleasant and cooperative in NAD Head:  Normocephalic and atraumatic. Eyes:  Sclera clear, no icterus.   Conjunctiva pink. Mouth:  No deformity or lesions.   Neck:  Supple; no masses or thyromegaly. Lungs:  Clear throughout to auscultation.   No wheezes. Heart:  Regular rate and rhythm; no murmurs. Abdomen:  Soft, non-tender, nondistended, normal bowel sounds, no rebound or guarding.  Msk:  Symmetrical. No boney deformities LAD: No inguinal or umbilical LAD Extremities:  No clubbing or edema. Neurologic:  Alert and  oriented x4;  grossly nonfocal Skin:  No obvious rash or bruise. Psych:  Alert and cooperative. Normal mood and affect.     Studies/Results: No results found.    Monica Duarte L. Tarri Glenn, MD, MPH 02/23/2023, 10:53 AM

## 2023-02-24 ENCOUNTER — Telehealth: Payer: Self-pay | Admitting: *Deleted

## 2023-02-24 NOTE — Telephone Encounter (Signed)
  Follow up Call-     02/23/2023   10:19 AM  Call back number  Post procedure Call Back phone  # [phone]  Permission to leave phone message Yes    [facility]

## 2023-03-02 ENCOUNTER — Encounter: Payer: Self-pay | Admitting: Gastroenterology

## 2023-04-12 ENCOUNTER — Other Ambulatory Visit: Payer: Self-pay | Admitting: Nurse Practitioner

## 2023-04-12 DIAGNOSIS — N939 Abnormal uterine and vaginal bleeding, unspecified: Secondary | ICD-10-CM

## 2023-06-01 ENCOUNTER — Encounter (HOSPITAL_COMMUNITY): Payer: Self-pay

## 2023-06-01 ENCOUNTER — Emergency Department (HOSPITAL_COMMUNITY): Payer: Medicaid Other

## 2023-06-01 ENCOUNTER — Emergency Department (HOSPITAL_COMMUNITY)
Admission: EM | Admit: 2023-06-01 | Discharge: 2023-06-01 | Disposition: A | Discharge status: Home / Self Care | Payer: Medicaid Other | Attending: Emergency Medicine | Admitting: Emergency Medicine

## 2023-06-01 DIAGNOSIS — Z79899 Other long term (current) drug therapy: Secondary | ICD-10-CM | POA: Diagnosis not present

## 2023-06-01 DIAGNOSIS — I10 Essential (primary) hypertension: Secondary | ICD-10-CM | POA: Diagnosis not present

## 2023-06-01 DIAGNOSIS — R079 Chest pain, unspecified: Secondary | ICD-10-CM | POA: Insufficient documentation

## 2023-06-01 DIAGNOSIS — E119 Type 2 diabetes mellitus without complications: Secondary | ICD-10-CM | POA: Diagnosis not present

## 2023-06-01 LAB — BASIC METABOLIC PANEL
Anion gap: 9 (ref 5–15)
BUN: 13 mg/dL (ref 8–23)
CO2: 27 mmol/L (ref 22–32)
Calcium: 9.4 mg/dL (ref 8.9–10.3)
Chloride: 105 mmol/L (ref 98–111)
Creatinine, Ser: 0.93 mg/dL (ref 0.44–1.00)
GFR, Estimated: >60 mL/min (ref >60)
Glucose, Bld: 124 mg/dL — ABNORMAL HIGH (ref 70–99)
Potassium: 3.4 mmol/L — ABNORMAL LOW (ref 3.5–5.1)
Sodium: 141 mmol/L (ref 135–145)

## 2023-06-01 LAB — CBC
HCT: 40.5 % (ref 36.0–46.0)
Hemoglobin: 13.2 g/dL (ref 12.0–15.0)
MCH: 25.8 pg — ABNORMAL LOW (ref 26.0–34.0)
MCHC: 32.6 g/dL (ref 30.0–36.0)
MCV: 79.1 fL — ABNORMAL LOW (ref 80.0–100.0)
Platelets: 321 10*3/uL (ref 150–400)
RBC: 5.12 MIL/uL — ABNORMAL HIGH (ref 3.87–5.11)
RDW: 15.4 % (ref 11.5–15.5)
WBC: 7.7 10*3/uL (ref 4.0–10.5)
nRBC: 0 % (ref 0.0–0.2)

## 2023-06-01 LAB — TROPONIN I (HIGH SENSITIVITY)
Troponin I (High Sensitivity): 3 ng/L (ref <18)
Troponin I (High Sensitivity): 3 ng/L (ref <18)

## 2023-06-01 MED ORDER — PANTOPRAZOLE SODIUM 20 MG PO TBEC: 20.0000 mg | DELAYED_RELEASE_TABLET | Freq: Two times a day (BID) | ORAL | 0 refills | Status: DC

## 2023-06-01 NOTE — ED Provider Notes (Signed)
[facility] AT [facility] Provider Note   CSN: [id number] Arrival date & time: 06/01/23  1323     History  Chief Complaint  Patient presents with   Chest Pain    Monica Duarte is a 67 y.o. female.   Chest Pain    Patient has a history of hypertension anxiety diabetes, reflux who presents to the ED with chest pain.  Patient states she had an episode this morning at about 5 AM that woke her up.  It was squeezing and burning.  It resolved and she was able to go back to sleep and then a couple hours later it started again.  She called her doctor's office and they suggested she come to the ED for evaluation.  Patient not currently having any pain now.  She denies any history of coronary artery disease.  She has never had symptoms like this before  Home Medications Prior to Admission medications   Medication Sig Start Date End Date Taking? Authorizing Provider  pantoprazole (PROTONIX) 20 MG tablet Take 1 tablet (20 mg total) by mouth 2 (two) times daily. 06/01/23  Yes Linwood Dibbles, MD  ACCU-CHEK GUIDE test strip  01/20/23   [provider]  Accu-Chek Softclix Lancets lancets SMARTSIG:Topical 01/20/23   [provider]  Blood Glucose Monitoring Suppl (ACCU-CHEK GUIDE) w/Device KIT See admin instructions. 12/19/22   [provider]  buPROPion (WELLBUTRIN XL) 150 MG 24 hr tablet  11/04/22   [provider]  hydrochlorothiazide (HYDRODIURIL) 25 MG tablet Take 25 mg by mouth daily. 12/06/22   [provider]  ibuprofen (ADVIL) 600 MG tablet Take 600 mg by mouth 3 (three) times daily as needed. Patient not taking: Reported on 01/26/2023 10/07/22   [provider]  JANUVIA 25 MG tablet Take 25 mg by mouth daily. 12/21/22   [provider]  meloxicam (MOBIC) 15 MG tablet Take 15 mg by mouth daily. 01/09/23   [provider]  mupirocin ointment (BACTROBAN) 2 % Apply topically. Patient not taking:  Reported on 01/26/2023 09/28/22   [provider]  terbinafine (LAMISIL) 250 MG tablet Take 250 mg by mouth daily. 01/10/23   [provider]      Allergies    Metformin and related and Sulfa antibiotics    Review of Systems   Review of Systems  Cardiovascular:  Positive for chest pain.    Physical Exam Updated Vital Signs BP 136/73   Pulse 68   Temp 98.5 F (36.9 C) (Oral)   Resp 20   Ht 1.676 m (5\' 6" )   Wt 129.3 kg   LMP 02/09/2014   SpO2 98%   BMI 46.00 kg/m  Physical Exam  ED Results / Procedures / Treatments   Labs (all labs ordered are listed, but only abnormal results are displayed) Labs Reviewed  BASIC METABOLIC PANEL - Abnormal; Notable for the following components:      Result Value   Potassium 3.4 (*)    Glucose, Bld 124 (*)    All other components within normal limits  CBC - Abnormal; Notable for the following components:   RBC 5.12 (*)    MCV 79.1 (*)    MCH 25.8 (*)    All other components within normal limits  TROPONIN I (HIGH SENSITIVITY)  TROPONIN I (HIGH SENSITIVITY)    EKG EKG Interpretation  Date/Time:  Thursday June 01 2023 13:33:12 EDT Ventricular Rate:  78 PR Interval:  143 QRS Duration: 93 QT  Interval:  336 QTC Calculation: 383 R Axis:   62 Text Interpretation: Sinus rhythm Borderline T abnormalities, diffuse leads No significant change since last tracing Confirmed by Linwood Dibbles [phone]) on 06/01/2023 3:53:26 PM  Radiology DG Chest 2 View  Result Date: 06/01/2023 CLINICAL DATA:  chest pain EXAM: CHEST - 2 VIEW COMPARISON:  Chest x-ray 05/25/2019 FINDINGS: The heart and mediastinal contours are unchanged. No focal consolidation. No pulmonary edema. No pleural effusion. No pneumothorax. No acute osseous abnormality. IMPRESSION: No active cardiopulmonary disease. Electronically Signed   By: Tish Frederickson M.D.   On: 06/01/2023 13:52    Procedures Procedures    Medications Ordered in ED Medications - No data to  display  ED Course/ Medical Decision Making/ A&P Clinical Course as of 06/01/23 1657  Thu Jun 01, 2023  1552 CBC normal.  Metabolic panel normal. [JK]  1552 Chest x-ray without acute findings [JK]  1647 Delta troponin normal [JK]    Clinical Course User Index [JK] Linwood Dibbles, MD             HEART Score: 3                Medical Decision Making Problems Addressed: Chest pain, unspecified type: acute illness or injury that poses a threat to life or bodily functions  Amount and/or Complexity of Data Reviewed Labs: ordered. Decision-making details documented in ED Course. Radiology: ordered and independent interpretation performed.  Risk Prescription drug management.   Patient presents to the ED for evaluation of chest pain.  Patient does not have a history of heart disease that she is aware of.  Patient described a pressure like burning sensation.  Patient is asymptomatic in the ER.  ED workup is reassuring.  Serial troponins are normal.  EKG and chest x-ray are reassuring..  Patient states she has changed her diet recently.  She has been eating a lot of takeout food.  It is possible that her symptoms may be related to acid reflux.  However with her age and risk factors I think she might benefit from outpatient stress testing.  Will place ambulatory referral to cardiology.  Discussed warning signs and precautions.       Final Clinical Impression(s) / ED Diagnoses Final diagnoses:  Chest pain, unspecified type    Rx / DC Orders ED Discharge Orders          Ordered    pantoprazole (PROTONIX) 20 MG tablet  2 times daily        06/01/23 1654    Ambulatory referral to Cardiology       Comments: If you have not heard from the Cardiology office within the next 72 hours please call [phone].   06/01/23 1654              Linwood Dibbles, MD 06/01/23 1657

## 2023-06-01 NOTE — ED Triage Notes (Signed)
Pt reports chest discomfort that woke her up out of sleep, felt like squeezing and burning. Subsided, then happened 2 hours later. Has been better since 0700. Has felt a little 'weird' since then but not the same.

## 2023-06-01 NOTE — Discharge Instructions (Addendum)
Start taking the antacid medications as prescribed.  Follow-up with a cardiologist to discuss further testing such as a stress test.  Return to the emergency room for any recurrent symptoms.

## 2023-06-12 ENCOUNTER — Other Ambulatory Visit: Payer: Medicaid Other

## 2023-06-12 ENCOUNTER — Ambulatory Visit
Admit: 2023-06-12 | Discharge: 2023-06-12 | Disposition: A | Discharge status: Home / Self Care | Payer: Medicaid Other | Attending: Nurse Practitioner | Admitting: Nurse Practitioner

## 2023-06-12 DIAGNOSIS — N939 Abnormal uterine and vaginal bleeding, unspecified: Secondary | ICD-10-CM

## 2023-06-21 NOTE — Therapy (Unsigned)
OUTPATIENT PHYSICAL THERAPY LOWER EXTREMITY EVALUATION   Patient Name: Monica Duarte MRN: [id number] DOB:Dec 07, 1956, 67 y.o., female Today's Date: 06/22/2023  END OF SESSION:  PT End of Session - 06/22/23 1536     Visit Number 1    Number of Visits 7    Date for PT Re-Evaluation 08/23/23    Authorization Type [place] [facility]    PT Start Time 1400    PT Stop Time 1445    PT Time Calculation (min) 45 min    Activity Tolerance Patient tolerated treatment well    Behavior During Therapy WFL for tasks assessed/performed             Past Medical History:  Diagnosis Date   Anxiety    Arthritis    Asthma    Depression    occasional   Diabetes mellitus without complication (HCC)    GERD (gastroesophageal reflux disease)    Hypertension    Obesity    Past Surgical History:  Procedure Laterality Date   ectopic     LAPAROSCOPIC GASTRIC BANDING     2009   LEEP     SALPINGECTOMY     Patient Active Problem List   Diagnosis Date Noted   LAP-BAND surgery status 04/06/2022   Trigger finger, left middle finger 06/16/2021   Unilateral primary osteoarthritis, right knee 06/16/2021   Obesity    High blood pressure 11/22/2020   Chronic right-sided low back pain 03/08/2019   Anxiety 09/21/2018   Stress at home 09/21/2018   Eczema 09/21/2018   Arthritis 09/21/2018    PCP: [facility] [place], Kela Millin   REFERRING PROVIDER: Nadyne Coombes, MD  REFERRING DIAG: M17.0 (ICD-10-CM) - Bilateral primary osteoarthritis of knee  THERAPY DIAG:  Chronic pain of right knee  Chronic pain of left knee  Muscle weakness (generalized)  Rationale for Evaluation and Treatment: Rehabilitation  ONSET DATE: chronic  SUBJECTIVE:   SUBJECTIVE STATEMENT: Relates a history of B knee pain R>L. Previous OPPT but unable to complete POC.  No planned f/u with ortho MD noted.  No recent imaging studies.  Has bee trying to lose weight on her own.  PERTINENT HISTORY: Asthma, HTN, BMI >45   PAIN:  Are you having pain? Yes: NPRS scale: 8/10 Pain location: B knees Pain description: ache Aggravating factors: prolonged positions and activity Relieving factors: heat, rest and non-weightbearing  PRECAUTIONS: None  RED FLAGS: None   WEIGHT BEARING RESTRICTIONS: No  FALLS:  Has patient fallen in last 6 months? No  OCCUPATION: not working  PLOF: Independent  PATIENT GOALS: To reduce and manage my knee symptoms  NEXT MD VISIT: none scheduled  OBJECTIVE:   DIAGNOSTIC FINDINGS: none recent  PATIENT SURVEYS:  FOTO 36(55 predicted)   MUSCLE LENGTH: Hamstrings: Right 80 deg; Left 80 deg   POSTURE:  B valgus  PALPATION: deferred  LOWER EXTREMITY ROM:  Active ROM Right eval Left eval  Hip flexion    Hip extension    Hip abduction    Hip adduction    Hip internal rotation    Hip external rotation    Knee flexion 108 118  Knee extension 0 0  Ankle dorsiflexion    Ankle plantarflexion    Ankle inversion    Ankle eversion     (Blank rows = not tested)  LOWER EXTREMITY MMT:  MMT Right eval Left eval  Hip flexion 4- 4-  Hip extension 4- 4-  Hip abduction 4- 4-  Hip adduction  Hip internal rotation    Hip external rotation    Knee flexion 4- 4-  Knee extension 4- 4-  Ankle dorsiflexion    Ankle plantarflexion 4- 4-  Ankle inversion    Ankle eversion     (Blank rows = not tested)  LOWER EXTREMITY SPECIAL TESTS:  Knee special tests: Patellafemoral grind test: positive  and varus/valgus stress negative  FUNCTIONAL TESTS:  5 times sit to stand: 24s arms crossed  GAIT: Distance walked: 16ft x2 Assistive device utilized: None Level of assistance: Complete Independence Comments: slower cadence   TODAY'S TREATMENT:                                                                                                                              DATE: 06/22/23 Eval   PATIENT EDUCATION:  Education details: Discussed eval findings, rehab  rationale and POC and patient is in agreement  Person educated: Patient Education method: Explanation Education comprehension: verbalized understanding and needs further education  HOME EXERCISE PROGRAM: Access Code: [email or web address] URL: [email or web address] Date: 06/22/2023 Prepared by: Gustavus Bryant  Exercises - Supine Bridge  - 2 x daily - 5 x weekly - 3 sets - 10 reps - Small Range Straight Leg Raise  - 2 x daily - 5 x weekly - 3 sets - 10 reps - Standing Heel Raise with Support  - 2 x daily - 5 x weekly - 3 sets - 10 reps  ASSESSMENT:  CLINICAL IMPRESSION: Patient is a 67 y.o. female who was seen today for physical therapy evaluation and treatment for chronic B knee pain due to underlying degenerative changes. She presents with decreased ROM into flexion but extension is full.  5x STS time is above normal limites.  LE strength deficits noted in B hips and ankles observed.    OBJECTIVE IMPAIRMENTS: Abnormal gait, decreased activity tolerance, decreased knowledge of condition, decreased mobility, difficulty walking, decreased ROM, decreased strength, improper body mechanics, obesity, and pain.   ACTIVITY LIMITATIONS: lifting, sitting, standing, squatting, stairs, and transfers  PERSONAL FACTORS: Fitness, Past/current experiences, Time since onset of injury/illness/exacerbation, and 1 comorbidity: DM  are also affecting patient's functional outcome.   REHAB POTENTIAL: Fair based on BMI and chronicity  CLINICAL DECISION MAKING: Stable/uncomplicated  EVALUATION COMPLEXITY: Low   GOALS: Goals reviewed with patient? No  SHORT TERM GOALS: Target date: 07/13/2023   Patient to demonstrate independence in HEP  Baseline: [id number] Goal status: INITIAL   LONG TERM GOALS: Target date: 08/03/2023    Increase BLE strength to 4/5 Baseline:  MMT Right eval Left eval  Hip flexion 4- 4-  Hip extension 4- 4-  Hip abduction 4- 4-  Hip adduction    Hip internal rotation     Hip external rotation    Knee flexion 4- 4-  Knee extension 4- 4-  Ankle dorsiflexion    Ankle plantarflexion 4- 4-   Goal status: INITIAL  2.  Decrease 5x STS time to 20s  arms crossed Baseline: 24s arms crossed Goal status: INITIAL  3.  Increase FOTO score to 55 Baseline: 36 Goal status: INITIAL     PLAN:  PT FREQUENCY: 1x/week  PT DURATION: 6 weeks  PLANNED INTERVENTIONS: Therapeutic exercises, Therapeutic activity, Neuromuscular re-education, Balance training, Gait training, Patient/Family education, Self Care, Joint mobilization, Stair training, DME instructions, Aquatic Therapy, Dry Needling, Electrical stimulation, Cryotherapy, Moist heat, Manual therapy, and Re-evaluation  PLAN FOR NEXT SESSION: HEP review and update, manual techniques as appropriate, aerobic tasks, ROM and flexibility activities, strengthening and PREs, TPDN, gait and balance training as needed     Hildred Laser, PT 06/22/2023, 3:37 PM   Check all possible CPT codes: 16109 - PT Re-evaluation, 97110- Therapeutic Exercise, [phone]- Neuro Re-education, [phone] - Gait Training, [phone] - Manual Therapy, 97530 - Therapeutic Activities, and 97535 - Self Care    Check all conditions that are expected to impact treatment: {Conditions expected to impact treatment:Morbid obesity and Diabetes mellitus   If treatment provided at initial evaluation, no treatment charged due to lack of authorization.

## 2023-06-22 ENCOUNTER — Ambulatory Visit: Payer: 59 | Attending: Internal Medicine

## 2023-06-22 ENCOUNTER — Other Ambulatory Visit: Payer: Self-pay

## 2023-06-22 DIAGNOSIS — M6281 Muscle weakness (generalized): Secondary | ICD-10-CM

## 2023-06-22 DIAGNOSIS — M25561 Pain in right knee: Secondary | ICD-10-CM | POA: Diagnosis present

## 2023-06-22 DIAGNOSIS — M25562 Pain in left knee: Secondary | ICD-10-CM | POA: Diagnosis present

## 2023-06-22 DIAGNOSIS — G8929 Other chronic pain: Secondary | ICD-10-CM | POA: Diagnosis present

## 2023-06-22 NOTE — Addendum Note (Signed)
Addended by: Hildred Laser on: 06/22/2023 03:51 PM   Modules accepted: Orders

## 2023-06-28 ENCOUNTER — Ambulatory Visit: Payer: 59

## 2023-07-05 ENCOUNTER — Ambulatory Visit: Payer: 59

## 2023-07-05 DIAGNOSIS — M25561 Pain in right knee: Secondary | ICD-10-CM | POA: Diagnosis not present

## 2023-07-05 DIAGNOSIS — G8929 Other chronic pain: Secondary | ICD-10-CM

## 2023-07-05 DIAGNOSIS — M6281 Muscle weakness (generalized): Secondary | ICD-10-CM

## 2023-07-05 NOTE — Therapy (Signed)
OUTPATIENT PHYSICAL THERAPY TREATMENT   Patient Name: Monica Duarte MRN: [id number] DOB:07/27/1956, 67 y.o., female Today's Date: 07/05/2023  END OF SESSION:  PT End of Session - 07/05/23 1446     Visit Number 2    Number of Visits 7    Date for PT Re-Evaluation 08/23/23    Authorization Type [place] MCD    PT Start Time 1446    PT Stop Time 1525    PT Time Calculation (min) 39 min    Activity Tolerance Patient tolerated treatment well    Behavior During Therapy WFL for tasks assessed/performed              Past Medical History:  Diagnosis Date   Anxiety    Arthritis    Asthma    Depression    occasional   Diabetes mellitus without complication (HCC)    GERD (gastroesophageal reflux disease)    Hypertension    Obesity    Past Surgical History:  Procedure Laterality Date   ectopic     LAPAROSCOPIC GASTRIC BANDING     2009   LEEP     SALPINGECTOMY     Patient Active Problem List   Diagnosis Date Noted   LAP-BAND surgery status 04/06/2022   Trigger finger, left middle finger 06/16/2021   Unilateral primary osteoarthritis, right knee 06/16/2021   Obesity    High blood pressure 11/22/2020   Chronic right-sided low back pain 03/08/2019   Anxiety 09/21/2018   Stress at home 09/21/2018   Eczema 09/21/2018   Arthritis 09/21/2018    PCP: [facility] [place], Kela Millin   REFERRING PROVIDER: Nadyne Coombes, MD  REFERRING DIAG: M17.0 (ICD-10-CM) - Bilateral primary osteoarthritis of knee  THERAPY DIAG:  Chronic pain of right knee  Chronic pain of left knee  Muscle weakness (generalized)  Rationale for Evaluation and Treatment: Rehabilitation  ONSET DATE: chronic  SUBJECTIVE:   SUBJECTIVE STATEMENT: Pt presents to PT with reports of continued bilateral knee pain. Has been compliant with HEP.   PERTINENT HISTORY: Asthma, HTN, BMI >45  PAIN:  Are you having pain?  Yes: NPRS scale: 8/10 Pain location: B knees Pain description:  ache Aggravating factors: prolonged positions and activity Relieving factors: heat, rest and non-weightbearing  PRECAUTIONS: None  RED FLAGS: None   WEIGHT BEARING RESTRICTIONS: No  FALLS:  Has patient fallen in last 6 months? No  OCCUPATION: not working  PLOF: Independent  PATIENT GOALS: To reduce and manage my knee symptoms  NEXT MD VISIT: none scheduled  OBJECTIVE:   DIAGNOSTIC FINDINGS: none recent  PATIENT SURVEYS:  FOTO 36(55 predicted)   MUSCLE LENGTH: Hamstrings: Right 80 deg; Left 80 deg   POSTURE:  B valgus  PALPATION: deferred  LOWER EXTREMITY ROM:  Active ROM Right eval Left eval  Hip flexion    Hip extension    Hip abduction    Hip adduction    Hip internal rotation    Hip external rotation    Knee flexion 108 118  Knee extension 0 0  Ankle dorsiflexion    Ankle plantarflexion    Ankle inversion    Ankle eversion     (Blank rows = not tested)  LOWER EXTREMITY MMT:  MMT Right eval Left eval  Hip flexion 4- 4-  Hip extension 4- 4-  Hip abduction 4- 4-  Hip adduction    Hip internal rotation    Hip external rotation    Knee flexion 4- 4-  Knee extension 4- 4-  Ankle dorsiflexion    Ankle plantarflexion 4- 4-  Ankle inversion    Ankle eversion     (Blank rows = not tested)  LOWER EXTREMITY SPECIAL TESTS:  Knee special tests: Patellafemoral grind test: positive  and varus/valgus stress negative  FUNCTIONAL TESTS:  5 times sit to stand: 24s arms crossed  GAIT: Distance walked: 65ft x2 Assistive device utilized: None Level of assistance: Complete Independence Comments: slower cadence   TREATMENT: [facility] Adult PT Treatment:                                                DATE: 07/05/2023 Therapeutic Exercise: NuStep lvl 5 UE/LE x 4 min while taking subjective Supine quad set x 10 - 5" hold Supine SLR 2x10 each Supine clamshell 2x15 blue band LAQ 2x10 2# FAQ 2x10 2# STS 2x10 - no UE high table Lateral walk RTB x 3  laps at counter Standing hip abd/ext 2x10 RTB  PATIENT EDUCATION:  Education details: continue HEP Person educated: Patient Education method: Explanation Education comprehension: verbalized understanding and needs further education  HOME EXERCISE PROGRAM: Access Code: [email or web address] URL: [email or web address] Date: 07/05/2023 Prepared by: Edwinna Areola  Exercises - Supine Bridge  - 2 x daily - 5 x weekly - 3 sets - 10 reps - Small Range Straight Leg Raise  - 2 x daily - 5 x weekly - 3 sets - 10 reps - Standing Heel Raise with Support  - 2 x daily - 5 x weekly - 3 sets - 10 reps - Supine Quadricep Sets  - 1 x daily - 7 x weekly - 2 sets - 10 reps - 5 sec hold - Active Straight Leg Raise with Quad Set  - 1 x daily - 7 x weekly - 2 sets - 10 reps  ASSESSMENT:  CLINICAL IMPRESSION: Pt was able to complete all prescribed exercises with no adverse effect. Therapy focused on quad and proximal hip strengthening for decreasing pain and improving mobility. Continues to benefit from skilled PT services, will progress as able per POC.   OBJECTIVE IMPAIRMENTS: Abnormal gait, decreased activity tolerance, decreased knowledge of condition, decreased mobility, difficulty walking, decreased ROM, decreased strength, improper body mechanics, obesity, and pain.   ACTIVITY LIMITATIONS: lifting, sitting, standing, squatting, stairs, and transfers  PERSONAL FACTORS: Fitness, Past/current experiences, Time since onset of injury/illness/exacerbation, and 1 comorbidity: DM  are also affecting patient's functional outcome.   GOALS: Goals reviewed with patient? No  SHORT TERM GOALS: Target date: 07/13/2023   Patient to demonstrate independence in HEP  Baseline: [id number] Goal status: INITIAL   LONG TERM GOALS: Target date: 08/03/2023    Increase BLE strength to 4/5 Baseline:  MMT Right eval Left eval  Hip flexion 4- 4-  Hip extension 4- 4-  Hip abduction 4- 4-  Hip adduction    Hip  internal rotation    Hip external rotation    Knee flexion 4- 4-  Knee extension 4- 4-  Ankle dorsiflexion    Ankle plantarflexion 4- 4-   Goal status: INITIAL  2.  Decrease 5x STS time to 20s arms crossed Baseline: 24s arms crossed Goal status: INITIAL  3.  Increase FOTO score to 55 Baseline: 36 Goal status: INITIAL     PLAN:  PT FREQUENCY: 1x/week  PT DURATION: 6 weeks  PLANNED INTERVENTIONS: Therapeutic exercises, Therapeutic activity, Neuromuscular re-education, Balance  training, Gait training, Patient/Family education, Self Care, Joint mobilization, Stair training, DME instructions, Aquatic Therapy, Dry Needling, Electrical stimulation, Cryotherapy, Moist heat, Manual therapy, and Re-evaluation  PLAN FOR NEXT SESSION: HEP review and update, manual techniques as appropriate, aerobic tasks, ROM and flexibility activities, strengthening and PREs, TPDN, gait and balance training as needed     Eloy End, PT 07/05/2023, 3:25 PM   Check all possible CPT codes: 32202 - PT Re-evaluation, 97110- Therapeutic Exercise, [phone]- Neuro Re-education, [phone] - Gait Training, [phone] - Manual Therapy, [phone] - Therapeutic Activities, and [phone] - Self Care    Check all conditions that are expected to impact treatment: {Conditions expected to impact treatment:Morbid obesity and Diabetes mellitus   If treatment provided at initial evaluation, no treatment charged due to lack of authorization.

## 2023-08-02 ENCOUNTER — Encounter: Payer: Medicaid Other | Admitting: Obstetrics and Gynecology

## 2023-08-21 ENCOUNTER — Ambulatory Visit: Payer: Medicaid Other | Attending: Cardiovascular Disease | Admitting: Cardiovascular Disease

## 2023-08-21 NOTE — Progress Notes (Deleted)
No chief complaint on file.  History of Present Illness: 67 yo female with history of anxiety, depression, DM, GERD, HTN and obesity who is here today as a new consult, referred by Dr. ***, for evaluation of chest pain. She was seen in the ED in June 2024 ***  Primary Care Physician: [facility] , [place].   Past Medical History:  Diagnosis Date   Anxiety    Arthritis    Asthma    Depression    occasional   Diabetes mellitus without complication (HCC)    GERD (gastroesophageal reflux disease)    Hypertension    Obesity     Past Surgical History:  Procedure Laterality Date   ectopic     LAPAROSCOPIC GASTRIC BANDING     2009   LEEP     SALPINGECTOMY      Current Outpatient Medications  Medication Sig Dispense Refill   ACCU-CHEK GUIDE test strip      Accu-Chek Softclix Lancets lancets SMARTSIG:Topical     Blood Glucose Monitoring Suppl (ACCU-CHEK GUIDE) w/Device KIT See admin instructions.     buPROPion (WELLBUTRIN XL) 150 MG 24 hr tablet      hydrochlorothiazide (HYDRODIURIL) 25 MG tablet Take 25 mg by mouth daily.     ibuprofen (ADVIL) 600 MG tablet Take 600 mg by mouth 3 (three) times daily as needed. (Patient not taking: Reported on 01/26/2023)     JANUVIA 25 MG tablet Take 25 mg by mouth daily.     meloxicam (MOBIC) 15 MG tablet Take 15 mg by mouth daily.     mupirocin ointment (BACTROBAN) 2 % Apply topically. (Patient not taking: Reported on 01/26/2023)     pantoprazole (PROTONIX) 20 MG tablet Take 1 tablet (20 mg total) by mouth 2 (two) times daily. 30 tablet 0   terbinafine (LAMISIL) 250 MG tablet Take 250 mg by mouth daily.     No current facility-administered medications for this visit.    Allergies  Allergen Reactions   Metformin And Related Other (See Comments)    Mouth soreness   Sulfa Antibiotics Other (See Comments)    Gives pt mouth sores    Social History   Socioeconomic History   Marital status: Divorced    Spouse name: Not on  file   Number of children: Not on file   Years of education: Not on file   Highest education level: Not on file  Occupational History   Not on file  Tobacco Use   Smoking status: Never   Smokeless tobacco: Never  Vaping Use   Vaping status: Never Used  Substance and Sexual Activity   Alcohol use: Yes    Comment: wine and liquor, most days   Drug use: No   Sexual activity: Not on file  Other Topics Concern   Not on file  Social History Narrative   Not on file   Social Determinants of Health   Financial Resource Strain: Not on file  Food Insecurity: Not on file  Transportation Needs: Not on file  Physical Activity: Not on file  Stress: Not on file  Social Connections: Not on file  Intimate Partner Violence: Not on file    Family History  Problem Relation Age of Onset   Hypertension Mother    Diabetes Mother    Colon cancer Neg Hx    Colon polyps Neg Hx    Esophageal cancer Neg Hx    Rectal cancer Neg Hx    Stomach cancer Neg  Hx     Review of Systems:  As stated in the HPI and otherwise negative.   LMP 02/09/2014   Physical Examination: General: Well developed, well nourished, NAD  HEENT: OP clear, mucus membranes moist  SKIN: warm, dry. No rashes. Neuro: No focal deficits  Musculoskeletal: Muscle strength 5/5 all ext  Psychiatric: Mood and affect normal  Neck: No JVD, no carotid bruits, no thyromegaly, no lymphadenopathy.  Lungs:Clear bilaterally, no wheezes, rhonci, crackles Cardiovascular: Regular rate and rhythm. No murmurs, gallops or rubs. Abdomen:Soft. Bowel sounds present. Non-tender.  Extremities: No lower extremity edema. Pulses are 2 + in the bilateral DP/PT.  EKG:  EKG {ACTION; IS/IS NWG:[id number]} ordered today. The ekg ordered today demonstrates ***  Recent Labs: 06/01/2023: BUN 13; Creatinine, Ser 0.93; Hemoglobin 13.2; Platelets 321; Potassium 3.4; Sodium 141   Lipid Panel    Component Value Date/Time   CHOL 151 04/07/2022 0000   TRIG  65 04/07/2022 0000   HDL 51 04/07/2022 0000   CHOLHDL 3.0 04/07/2022 0000   LDLCALC 87 04/07/2022 0000     Wt Readings from Last 3 Encounters:  06/01/23 129.3 kg  02/23/23 129.3 kg  01/26/23 129.3 kg      Assessment and Plan:   1.   Labs/ tests ordered today include:  No orders of the defined types were placed in this encounter.    Disposition:   F/U with me in ***    Signed, Verne Carrow, MD, [facility] 08/21/2023 6:50 AM    [facility] [address] [place], [place], [place]  [address] Phone: [phone]; Fax: [phone]

## 2023-08-22 ENCOUNTER — Encounter: Payer: Self-pay | Admitting: Cardiovascular Disease

## 2023-09-04 ENCOUNTER — Other Ambulatory Visit: Payer: Self-pay | Admitting: Internal Medicine

## 2023-09-04 DIAGNOSIS — Z1231 Encounter for screening mammogram for malignant neoplasm of breast: Secondary | ICD-10-CM

## 2023-10-04 ENCOUNTER — Other Ambulatory Visit (HOSPITAL_COMMUNITY)
Admission: RE | Admit: 2023-10-04 | Discharge: 2023-10-04 | Disposition: A | Discharge status: Home / Self Care | Payer: 59 | Source: Ambulatory Visit | Attending: Obstetrics and Gynecology | Admitting: Obstetrics and Gynecology

## 2023-10-04 ENCOUNTER — Ambulatory Visit
Admission: RE | Admit: 2023-10-04 | Discharge: 2023-10-04 | Disposition: A | Discharge status: Home / Self Care | Payer: 59 | Source: Ambulatory Visit

## 2023-10-04 ENCOUNTER — Other Ambulatory Visit: Payer: Self-pay

## 2023-10-04 ENCOUNTER — Ambulatory Visit (INDEPENDENT_AMBULATORY_CARE_PROVIDER_SITE_OTHER): Payer: 59 | Admitting: Obstetrics and Gynecology

## 2023-10-04 ENCOUNTER — Encounter: Payer: Self-pay | Admitting: Obstetrics and Gynecology

## 2023-10-04 VITALS — BP 108/63 | HR 84 | Wt 288.2 lb

## 2023-10-04 DIAGNOSIS — N950 Postmenopausal bleeding: Secondary | ICD-10-CM | POA: Insufficient documentation

## 2023-10-04 DIAGNOSIS — N939 Abnormal uterine and vaginal bleeding, unspecified: Secondary | ICD-10-CM

## 2023-10-04 DIAGNOSIS — Z1231 Encounter for screening mammogram for malignant neoplasm of breast: Secondary | ICD-10-CM

## 2023-10-04 DIAGNOSIS — Z1330 Encounter for screening examination for mental health and behavioral disorders, unspecified: Secondary | ICD-10-CM | POA: Diagnosis not present

## 2023-10-04 DIAGNOSIS — Z1272 Encounter for screening for malignant neoplasm of vagina: Secondary | ICD-10-CM

## 2023-10-04 NOTE — Progress Notes (Unsigned)
NEW GYNECOLOGY PATIENT Patient name: Monica Duarte MRN [id number]  Date of birth: December 18, 1955 Chief Complaint:   Abnormal Uterine Bleeding     History:  Monica Duarte is a 67 y.o. No obstetric history on file. being seen today for AUB/PMB.  Had been aenorrheic for 10  years. Had bleeding, went to hospital, they didn't know, then returned 1 year later. No new medications, no weight changes, hair changes (falling out). No BM changes.  Had a LEEP in 93/95. Paps normal since, last pap was 2 years.  Has been going on for months and has not been having bleeding for 2 weeks. Had some kind of body contour procedure and the bleeding stopped afterwards.  Bleeding was various in flow, occasional clots, but continued daily for the few months until 2 weeks ago. No pain associated with the bleeding. US done when the bleeding first started.       Gynecologic History Patient's last menstrual period was 02/09/2014. Contraception: post menopausal status/abstinence Last Pap: reports done 2 years ago and was normal  Last Mammogram:  scheduled Last Colonoscopy:  2024  Obstetric History OB History  No obstetric history on file.    Past Medical History:  Diagnosis Date   Anxiety    Arthritis    Asthma    Depression    occasional   Diabetes mellitus without complication (HCC)    GERD (gastroesophageal reflux disease)    Hypertension    Obesity     Past Surgical History:  Procedure Laterality Date   ectopic     LAPAROSCOPIC GASTRIC BANDING     2009   LEEP     SALPINGECTOMY      Current Outpatient Medications on File Prior to Visit  Medication Sig Dispense Refill   ACCU-CHEK GUIDE test strip      Accu-Chek Softclix Lancets lancets SMARTSIG:Topical     Blood Glucose Monitoring Suppl (ACCU-CHEK GUIDE) w/Device KIT See admin instructions.     buPROPion (WELLBUTRIN XL) 150 MG 24 hr tablet      hydrochlorothiazide (HYDRODIURIL) 25 MG tablet Take 25 mg by mouth daily.      pantoprazole (PROTONIX) 20 MG tablet Take 1 tablet (20 mg total) by mouth 2 (two) times daily. 30 tablet 0   ibuprofen (ADVIL) 600 MG tablet Take 600 mg by mouth 3 (three) times daily as needed. (Patient not taking: Reported on 01/26/2023)     JANUVIA 25 MG tablet Take 25 mg by mouth daily.     meloxicam (MOBIC) 15 MG tablet Take 15 mg by mouth daily. (Patient not taking: Reported on 10/04/2023)     mupirocin ointment (BACTROBAN) 2 % Apply topically. (Patient not taking: Reported on 01/26/2023)     terbinafine (LAMISIL) 250 MG tablet Take 250 mg by mouth daily.     No current facility-administered medications on file prior to visit.    Allergies  Allergen Reactions   Metformin And Related Other (See Comments)    Mouth soreness   Sulfa Antibiotics Other (See Comments)    Gives pt mouth sores    Social History:  reports that she has never smoked. She has never used smokeless tobacco. She reports current alcohol use. She reports that she does not use drugs.  Family History  Problem Relation Age of Onset   Hypertension Mother    Diabetes Mother    Colon cancer Neg Hx    Colon polyps Neg Hx    Esophageal cancer Neg Hx    Rectal  cancer Neg Hx    Stomach cancer Neg Hx     The following portions of the patient's history were reviewed and updated as appropriate: allergies, current medications, past family history, past medical history, past social history, past surgical history and problem list.  Review of Systems Pertinent items noted in HPI and remainder of comprehensive ROS otherwise negative.  Physical Exam:  BP 108/63   Pulse 84   Wt 288 lb 3.2 oz (130.7 kg)   LMP 02/09/2014   BMI 46.52 kg/m  Physical Exam Vitals and nursing note reviewed. Exam conducted with a chaperone present.  Constitutional:      Appearance: Normal appearance.  Pulmonary:     Effort: Pulmonary effort is normal.  Abdominal:     Palpations: Abdomen is soft.  Genitourinary:    General: Normal vulva.      Exam position: Lithotomy position.  Neurological:     Mental Status: She is alert.    Endometrial Biopsy Procedure  Patient identified, informed consent performed,  indication reviewed, consent signed.  Reviewed risk of perforation, pain, bleeding, insufficient sample, etc were reviewd. Time out was performed.  Urine pregnancy test negative.  Speculum placed in the vagina.  Cervix visualized.  Cleaned with Betadine x 2.  Anterior cervix grasped anteriorly with a single tooth tenaculum.  Paracervical block was administered.  Endometrial pipelle was used to draw up 1cc of 1% lidocaine, introduced into the cervical os and instilled into the endometrial cavity.  The pipelle was passed twice without difficulty and sample obtained. Tenaculum was removed, good hemostasis noted.  Patient tolerated procedure well.      Assessment and Plan:   1. Postmenopausal bleeding Now s/p uncomplicated EMB. Noted that EMS on TVUS is technically within normal but given the duration and heaviness of bleeding, recommended EMB. Will follow up results.  - Surgical pathology  2. Abnormal uterine bleeding (AUB) Patient has abnormal uterine bleeding . She has a normal exam, no evidence of lesions.  Will order abnormal uterine bleeding evaluation labs. TVUS previously completed.  Will contact patient with these results and plans for further evaluation/management. - TSH Rfx on Abnormal to Free T4 - Hemoglobin A1c - CBC - Surgical pathology  3. Vaginal Pap smear Routine pap smear collected - Cytology - PAP( [place])  Routine preventative health maintenance measures emphasized. Please refer to After Visit Summary for other counseling recommendations.   Follow-up: No follow-ups on file.      Lorriane Shire, MD Obstetrician & Gynecologist, [facility] for [employer], [facility]

## 2023-10-05 LAB — CBC
Hematocrit: 44.3 % (ref 34.0–46.6)
Hemoglobin: 14.1 g/dL (ref 11.1–15.9)
MCH: 25.7 pg — ABNORMAL LOW (ref 26.6–33.0)
MCHC: 31.8 g/dL (ref 31.5–35.7)
MCV: 81 fL (ref 79–97)
Platelets: 345 10*3/uL (ref 150–450)
RBC: 5.48 x10E6/uL — ABNORMAL HIGH (ref 3.77–5.28)
RDW: 14.5 % (ref 11.7–15.4)
WBC: 7.4 10*3/uL (ref 3.4–10.8)

## 2023-10-05 LAB — HEMOGLOBIN A1C
Est. average glucose Bld gHb Est-mCnc: 140 mg/dL
Hgb A1c MFr Bld: 6.5 % — ABNORMAL HIGH (ref 4.8–5.6)

## 2023-10-05 LAB — TSH RFX ON ABNORMAL TO FREE T4: TSH: 0.978 u[IU]/mL (ref 0.450–4.500)

## 2023-10-09 LAB — SURGICAL PATHOLOGY

## 2023-10-10 ENCOUNTER — Telehealth: Payer: Self-pay

## 2023-10-10 ENCOUNTER — Telehealth: Payer: Self-pay | Admitting: Obstetrics and Gynecology

## 2023-10-10 NOTE — Telephone Encounter (Signed)
Called patient x2, no answer, LVM. Will attempt to reach patient tomorrow.

## 2023-10-10 NOTE — Telephone Encounter (Signed)
-----   Message -----  From: Lorriane Shire, MD  Sent: 10/05/2023   3:04 PM EDT  To: [facility] Clinical Pool   Notify that blood count and thyroid function are normal but that A1c has reached diabetes level. Would recommend following up with primary care as may benefit from meds. Pap and biopsy are still pending   ---------------------------------------  Called pt to review results. Pt states she was trying to return call to Briscoe Deutscher, MD. Will notify provider.

## 2023-10-12 ENCOUNTER — Telehealth: Payer: Self-pay | Admitting: Obstetrics and Gynecology

## 2023-10-12 DIAGNOSIS — N8500 Endometrial hyperplasia, unspecified: Secondary | ICD-10-CM

## 2023-10-12 MED ORDER — MEGESTROL ACETATE 40 MG PO TABS
40.0000 mg | ORAL_TABLET | Freq: Two times a day (BID) | ORAL | 5 refills | Status: DC
Start: 2023-10-12 — End: 2024-07-24

## 2023-10-12 NOTE — Telephone Encounter (Signed)
Called patient confirmed ID x 2. Reviewed pathology. Noted that we can proceed with medical or surgical treatment with repeat biopsy in 3-6 months. Patient would prefer medical treatment first. Discussed use of IUD or pills, patient would prefer pills for now. Confirmed pharmacy. Discussed repeat sampling in clinic vs OR, patient prefers OR. Will plan for hysteroscopy , D&C; will have preop visit once scheduled.   FINAL MICROSCOPIC DIAGNOSIS:   A. 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

## 2023-10-13 LAB — CYTOLOGY - PAP
Comment: NEGATIVE
Diagnosis: NEGATIVE
High risk HPV: NEGATIVE

## 2023-11-15 ENCOUNTER — Telehealth: Payer: Self-pay

## 2023-11-15 NOTE — Telephone Encounter (Signed)
Patient called back to schedule procedure with Dr. Briscoe Deutscher on 12/26/23 at [facility] at 10:30 am. Patient was provided pre-op instructions over the phone and is aware she must arrive at 8:30 am. Written details will be sent to patients Mychart once surgery is scheduled.

## 2023-11-15 NOTE — Telephone Encounter (Signed)
Left message asking the patient to call me back to schedule surgery with Dr. Briscoe Deutscher. [phone].

## 2023-12-25 ENCOUNTER — Encounter (HOSPITAL_COMMUNITY): Payer: Self-pay | Admitting: Obstetrics and Gynecology

## 2023-12-25 NOTE — Progress Notes (Signed)
 SDW call  Patient was given pre-op instructions over the phone. Patient verbalized understanding of instructions provided.     PCP - [facility] Cardiologist -  Pulmonary:    PPM/ICD - denies Device Orders - na Rep Notified - na   Chest x-ray - 06/01/2023 EKG -  06/01/2023 Stress Test - ECHO -  Cardiac Cath -   Sleep Study/sleep apnea/CPAP: Yes. Does not wear a CPAP  Type II diabetic.  Takes no medications for diabetes Fasting Blood sugar range: 110-120 How often check sugars: weekly   Blood Thinner Instructions: denies Aspirin Instructions:denies   ERAS Protcol - Clears until 0900  Anesthesia review: No   Patient denies shortness of breath, fever, cough and chest pain over the phone call  Your procedure is scheduled on Tuesday December 26, 2023  Report to [facility] Main Entrance A at 0930  A.M., then check in with the Admitting office.  Call this number if you have problems the morning of surgery:  [phone]   If you have any questions prior to your surgery date call [phone]: Open Monday-Friday 8am-4pm If you experience any cold or flu symptoms such as cough, fever, chills, shortness of breath, etc. between now and your scheduled surgery, please notify us  at the above number    Remember:  Do not eat after midnight the night before your surgery  You may drink clear liquids until 0900  the morning of your surgery.   Clear liquids allowed are: Water , Non-Citrus Juices (without pulp), Carbonated Beverages, Clear Tea, Black Coffee ONLY (NO MILK, CREAM OR POWDERED CREAMER of any kind), and Gatorade   Take these medicines the morning of surgery with A SIP OF WATER :  Finasteride, megestrol , crestor  As of today, STOP taking any Aspirin (unless otherwise instructed by your surgeon) Aleve , Naproxen , Ibuprofen , Motrin , Advil , Goody's, BC's, all herbal medications, fish oil, and all vitamins.

## 2023-12-26 ENCOUNTER — Ambulatory Visit (HOSPITAL_COMMUNITY)
Admission: RE | Admit: 2023-12-26 | Discharge: 2023-12-26 | Disposition: A | Discharge status: Home / Self Care | Payer: 59 | Attending: Obstetrics and Gynecology | Admitting: Obstetrics and Gynecology

## 2023-12-26 ENCOUNTER — Encounter (HOSPITAL_COMMUNITY): Payer: Self-pay | Admitting: Obstetrics and Gynecology

## 2023-12-26 ENCOUNTER — Encounter (HOSPITAL_COMMUNITY)
Admission: RE | Disposition: A | Discharge status: Home / Self Care | Payer: Self-pay | Source: Home / Self Care | Attending: Obstetrics and Gynecology

## 2023-12-26 ENCOUNTER — Ambulatory Visit (HOSPITAL_COMMUNITY): Payer: 59

## 2023-12-26 ENCOUNTER — Ambulatory Visit (HOSPITAL_BASED_OUTPATIENT_CLINIC_OR_DEPARTMENT_OTHER): Payer: 59

## 2023-12-26 ENCOUNTER — Other Ambulatory Visit: Payer: Self-pay

## 2023-12-26 DIAGNOSIS — N8500 Endometrial hyperplasia, unspecified: Secondary | ICD-10-CM

## 2023-12-26 DIAGNOSIS — Z7984 Long term (current) use of oral hypoglycemic drugs: Secondary | ICD-10-CM | POA: Insufficient documentation

## 2023-12-26 DIAGNOSIS — E119 Type 2 diabetes mellitus without complications: Secondary | ICD-10-CM | POA: Insufficient documentation

## 2023-12-26 DIAGNOSIS — G4730 Sleep apnea, unspecified: Secondary | ICD-10-CM | POA: Insufficient documentation

## 2023-12-26 DIAGNOSIS — E66813 Obesity, class 3: Secondary | ICD-10-CM | POA: Insufficient documentation

## 2023-12-26 DIAGNOSIS — Z79899 Other long term (current) drug therapy: Secondary | ICD-10-CM | POA: Insufficient documentation

## 2023-12-26 DIAGNOSIS — Z6841 Body Mass Index (BMI) 40.0 and over, adult: Secondary | ICD-10-CM | POA: Insufficient documentation

## 2023-12-26 DIAGNOSIS — N950 Postmenopausal bleeding: Secondary | ICD-10-CM | POA: Diagnosis not present

## 2023-12-26 DIAGNOSIS — N8501 Benign endometrial hyperplasia: Secondary | ICD-10-CM | POA: Diagnosis not present

## 2023-12-26 DIAGNOSIS — I10 Essential (primary) hypertension: Secondary | ICD-10-CM

## 2023-12-26 DIAGNOSIS — J45909 Unspecified asthma, uncomplicated: Secondary | ICD-10-CM

## 2023-12-26 DIAGNOSIS — K219 Gastro-esophageal reflux disease without esophagitis: Secondary | ICD-10-CM | POA: Diagnosis not present

## 2023-12-26 DIAGNOSIS — F418 Other specified anxiety disorders: Secondary | ICD-10-CM | POA: Diagnosis not present

## 2023-12-26 HISTORY — DX: Sleep apnea, unspecified: G47.30

## 2023-12-26 HISTORY — PX: HYSTEROSCOPY WITH D & C: SHX1775

## 2023-12-26 LAB — BASIC METABOLIC PANEL
Anion gap: 10 (ref 5–15)
BUN: 7 mg/dL — ABNORMAL LOW (ref 8–23)
CO2: 24 mmol/L (ref 22–32)
Calcium: 9.6 mg/dL (ref 8.9–10.3)
Chloride: 108 mmol/L (ref 98–111)
Creatinine, Ser: 0.96 mg/dL (ref 0.44–1.00)
GFR, Estimated: >60 mL/min (ref >60)
Glucose, Bld: 107 mg/dL — ABNORMAL HIGH (ref 70–99)
Potassium: 3.8 mmol/L (ref 3.5–5.1)
Sodium: 142 mmol/L (ref 135–145)

## 2023-12-26 LAB — CBC
HCT: 41.1 % (ref 36.0–46.0)
Hemoglobin: 13.6 g/dL (ref 12.0–15.0)
MCH: 25.9 pg — ABNORMAL LOW (ref 26.0–34.0)
MCHC: 33.1 g/dL (ref 30.0–36.0)
MCV: 78.1 fL — ABNORMAL LOW (ref 80.0–100.0)
Platelets: 329 10*3/uL (ref 150–400)
RBC: 5.26 MIL/uL — ABNORMAL HIGH (ref 3.87–5.11)
RDW: 15.3 % (ref 11.5–15.5)
WBC: 7.3 10*3/uL (ref 4.0–10.5)
nRBC: 0 % (ref 0.0–0.2)

## 2023-12-26 LAB — ABO/RH: ABO/RH(D): O POS

## 2023-12-26 LAB — GLUCOSE, CAPILLARY
Glucose-Capillary: 113 mg/dL — ABNORMAL HIGH (ref 70–99)
Glucose-Capillary: 72 mg/dL (ref 70–99)

## 2023-12-26 LAB — TYPE AND SCREEN
ABO/RH(D): O POS
Antibody Screen: NEGATIVE

## 2023-12-26 SURGERY — DILATATION AND CURETTAGE /HYSTEROSCOPY
Anesthesia: General | Site: Vagina

## 2023-12-26 MED ORDER — PROPOFOL 10 MG/ML IV BOLUS
INTRAVENOUS | Status: AC
  Filled 2023-12-26: qty 20

## 2023-12-26 MED ORDER — FENTANYL CITRATE (PF) 250 MCG/5ML IJ SOLN
INTRAMUSCULAR | Status: AC
  Filled 2023-12-26: qty 5

## 2023-12-26 MED ORDER — DEXAMETHASONE SODIUM PHOSPHATE 10 MG/ML IJ SOLN
INTRAMUSCULAR | Status: AC
  Filled 2023-12-26: qty 1

## 2023-12-26 MED ORDER — LIDOCAINE 2% (20 MG/ML) 5 ML SYRINGE
INTRAMUSCULAR | Status: AC
  Filled 2023-12-26: qty 5

## 2023-12-26 MED ORDER — LIDOCAINE 2% (20 MG/ML) 5 ML SYRINGE
INTRAMUSCULAR | Status: DC | PRN
  Administered 2023-12-26: 60 mg via INTRAVENOUS

## 2023-12-26 MED ORDER — IBUPROFEN 600 MG PO TABS: 600.0000 mg | ORAL_TABLET | Freq: Four times a day (QID) | ORAL | 2 refills | Status: AC | PRN

## 2023-12-26 MED ORDER — LACTATED RINGERS IV SOLN: INTRAVENOUS | Status: DC

## 2023-12-26 MED ORDER — ACETAMINOPHEN 500 MG PO TABS
1000.0000 mg | ORAL_TABLET | ORAL | Status: AC
  Administered 2023-12-26: 1000 mg via ORAL
  Filled 2023-12-26: qty 2

## 2023-12-26 MED ORDER — PROPOFOL 10 MG/ML IV BOLUS
INTRAVENOUS | Status: DC | PRN
  Administered 2023-12-26: 150 mg via INTRAVENOUS

## 2023-12-26 MED ORDER — FENTANYL CITRATE (PF) 100 MCG/2ML IJ SOLN: 25.0000 ug | INTRAMUSCULAR | Status: DC | PRN

## 2023-12-26 MED ORDER — ORAL CARE MOUTH RINSE: 15.0000 mL | Freq: Once | OROMUCOSAL | Status: AC

## 2023-12-26 MED ORDER — MIDAZOLAM HCL 2 MG/2ML IJ SOLN
INTRAMUSCULAR | Status: AC
  Filled 2023-12-26: qty 2

## 2023-12-26 MED ORDER — KETOROLAC TROMETHAMINE 30 MG/ML IJ SOLN
INTRAMUSCULAR | Status: AC
  Filled 2023-12-26: qty 1

## 2023-12-26 MED ORDER — ONDANSETRON HCL 4 MG/2ML IJ SOLN
INTRAMUSCULAR | Status: DC | PRN
  Administered 2023-12-26: 4 mg via INTRAVENOUS

## 2023-12-26 MED ORDER — KETOROLAC TROMETHAMINE 30 MG/ML IJ SOLN
INTRAMUSCULAR | Status: DC | PRN
  Administered 2023-12-26: 30 mg via INTRAVENOUS

## 2023-12-26 MED ORDER — ONDANSETRON HCL 4 MG/2ML IJ SOLN
INTRAMUSCULAR | Status: AC
  Filled 2023-12-26: qty 2

## 2023-12-26 MED ORDER — INSULIN ASPART 100 UNIT/ML IJ SOLN: 0.0000 [IU] | INTRAMUSCULAR | Status: DC | PRN

## 2023-12-26 MED ORDER — MIDAZOLAM HCL 2 MG/2ML IJ SOLN
INTRAMUSCULAR | Status: DC | PRN
  Administered 2023-12-26: 2 mg via INTRAVENOUS

## 2023-12-26 MED ORDER — SODIUM CHLORIDE 0.9 % IR SOLN
Status: DC | PRN
  Administered 2023-12-26: 3000 mL

## 2023-12-26 MED ORDER — BUPIVACAINE HCL (PF) 0.5 % IJ SOLN
INTRAMUSCULAR | Status: AC
  Filled 2023-12-26: qty 30

## 2023-12-26 MED ORDER — CHLORHEXIDINE GLUCONATE 0.12 % MT SOLN
15.0000 mL | Freq: Once | OROMUCOSAL | Status: AC
Start: 2023-12-26 — End: 2023-12-26
  Administered 2023-12-26: 15 mL via OROMUCOSAL
  Filled 2023-12-26: qty 15

## 2023-12-26 MED ORDER — FENTANYL CITRATE (PF) 250 MCG/5ML IJ SOLN
INTRAMUSCULAR | Status: DC | PRN
  Administered 2023-12-26 (×3): 50 ug via INTRAVENOUS

## 2023-12-26 MED ORDER — BUPIVACAINE HCL (PF) 0.5 % IJ SOLN
INTRAMUSCULAR | Status: DC | PRN
  Administered 2023-12-26: 20 mL

## 2023-12-26 MED ORDER — DEXAMETHASONE SODIUM PHOSPHATE 10 MG/ML IJ SOLN
INTRAMUSCULAR | Status: DC | PRN
  Administered 2023-12-26: 5 mg via INTRAVENOUS

## 2023-12-26 SURGICAL SUPPLY — 16 items
CATH ROBINSON RED A/P 16FR (CATHETERS) IMPLANT
DEVICE MYOSURE LITE (MISCELLANEOUS) IMPLANT
DEVICE MYOSURE REACH (MISCELLANEOUS) IMPLANT
GAUZE 4X4 16PLY ~~LOC~~+RFID DBL (SPONGE) IMPLANT
GLOVE BIOGEL PI IND STRL 7.0 (GLOVE) IMPLANT
GLOVE ECLIPSE 7.0 STRL STRAW (GLOVE) ×1 IMPLANT
GOWN STRL REUS W/TWL LRG LVL3 (GOWN DISPOSABLE) ×1 IMPLANT
GOWN STRL REUS W/TWL XL LVL3 (GOWN DISPOSABLE) ×1 IMPLANT
KIT PROCEDURE FLUENT (KITS) ×1 IMPLANT
KIT TURNOVER KIT A (KITS) ×1 IMPLANT
MYOSURE XL FIBROID (MISCELLANEOUS)
PACK VAGINAL MINOR WOMEN LF (CUSTOM PROCEDURE TRAY) ×1 IMPLANT
PAD OB MATERNITY 4.3X12.25 (PERSONAL CARE ITEMS) ×1 IMPLANT
SEAL CERVICAL OMNI LOK (ABLATOR) IMPLANT
SEAL ROD LENS SCOPE MYOSURE (ABLATOR) ×1 IMPLANT
SYSTEM TISS REMOVAL MYOSURE XL (MISCELLANEOUS) IMPLANT

## 2023-12-26 NOTE — Anesthesia Preprocedure Evaluation (Addendum)
 Anesthesia Evaluation  Patient identified by MRN, date of birth, ID band Patient awake    Reviewed: Allergy & Precautions, H&P , NPO status , Patient's Chart, lab work & pertinent test results  Airway Mallampati: III  TM Distance: >3 FB Neck ROM: Full    Dental no notable dental hx. (+) Teeth Intact, Dental Advisory Given   Pulmonary asthma , sleep apnea    Pulmonary exam normal breath sounds clear to auscultation       Cardiovascular hypertension, Pt. on medications  Rhythm:Regular Rate:Normal     Neuro/Psych   Anxiety Depression    negative neurological ROS     GI/Hepatic Neg liver ROS,GERD  Medicated,,  Endo/Other  diabetes, Type 2, Oral Hypoglycemic Agents  Class 3 obesity  Renal/GU negative Renal ROS  negative genitourinary   Musculoskeletal  (+) Arthritis , Osteoarthritis,    Abdominal   Peds  Hematology negative hematology ROS (+)   Anesthesia Other Findings   Reproductive/Obstetrics negative OB ROS                             Anesthesia Physical Anesthesia Plan  ASA: 3  Anesthesia Plan: General   Post-op Pain Management: Tylenol  PO (pre-op)*   Induction: Intravenous  PONV Risk Score and Plan: 4 or greater and Ondansetron  and Dexamethasone   Airway Management Planned: LMA  Additional Equipment:   Intra-op Plan:   Post-operative Plan: Extubation in OR  Informed Consent: I have reviewed the patients History and Physical, chart, labs and discussed the procedure including the risks, benefits and alternatives for the proposed anesthesia with the patient or authorized representative who has indicated his/her understanding and acceptance.     Dental advisory given  Plan Discussed with: CRNA  Anesthesia Plan Comments:        Anesthesia Quick Evaluation

## 2023-12-26 NOTE — Discharge Instructions (Signed)
 Post-surgical Instructions, Outpatient Surgery  You may expect to feel dizzy, weak, and drowsy for as long as 24 hours after receiving the medicine that made you sleep (anesthetic). For the first 24 hours after your surgery:   Do not drive a car, ride a bicycle, participate in physical activities, or take public transportation until you are done taking narcotic pain medicines or as directed by Dr. Briscoe Deutscher.  Do not drink alcohol or take tranquilizers.  Do not take medicine that has not been prescribed by your physicians.  Do not sign important papers or make important decisions while on narcotic pain medicines.  Have a responsible person with you.    PAIN MANAGEMENT Ibuprofen 800mg .  (This is the same as 4-200mg  over the counter tablets of Motrin or ibuprofen.)  Take this every 6 hours or as needed for cramping.   Acetaminophen 1000mg  (This is the same as 2-500mg  over the counter extra strength tylenol). Take this every 6 hours for the first 3 days or as needed afterwards for pain  DO'S AND DON'T'S Do not take a tub bath for 2 weeks.  You may shower on the first day after your surgery Do not do any heavy lifting for one to two weeks.  This increases the chance of bleeding. Do move around as you feel able.  Stairs are fine.  You may begin to exercise again as you feel able.  Do not lift any weights for two weeks. Do not put anything in the vagina for two weeks--no tampons, intercourse, or douching.    REGULAR MEDIATIONS/VITAMINS: You may restart all of your regular medications as prescribed. You may restart all of your vitamins as you normally take them.    PLEASE CALL OR SEEK MEDICAL CARE IF: You have persistent nausea and vomiting.  You have trouble eating or drinking.  You have an oral temperature above 100.5.  You have constipation that is not helped by adjusting diet or increasing fluid intake. Pain medicines are a common cause of constipation.  You have heavy vaginal bleeding You  have redness or drainage from your incision(s) or there is increasing pain or tenderness near or in the surgical site.

## 2023-12-26 NOTE — Op Note (Signed)
 Analeah Brame [place] PROCEDURE DATE: 12/26/2023  PREOPERATIVE DIAGNOSIS: endometrial hyperplasia without atypia  POSTOPERATIVE DIAGNOSIS: endometrial hyperplasia without atypia PROCEDURE:    operative hysteroscopy, dilation and curettage SURGEON: Carter Quarry, MD ASSISTANT:  none  INDICATIONS: 68 y.o. No obstetric history on file. with simple endometrial hyperplasia.  Risks of surgery were discussed with the patient including but not limited to: bleeding which may require transfusion; infection which may require antibiotics; injury to surrounding organs; need for additional procedures including laparotomy;  and other postoperative/anesthesia complications. Written informed consent was obtained.    FINDINGS:  Normal external genitalia, normal external genitalia, normal appearing cervix Hysteroscopically: atrophic endocervical canal, scarred tissue in left uterine fundus, bilateral tubal ostia visualized  ANESTHESIA: General, paracervical block INTRAVENOUS FLUIDS:  600 ml of LR ESTIMATED BLOOD LOSS:  30 ml URINE OUTPUT: 100 ml SPECIMENS: endometrial curettings COMPLICATIONS:  None immediate.   FLUID DEFICIT: 85 ml of normal saline  PROCEDURE: The patient was taken to the operating room and placed under general anesthesia. SCDs were in place.  Time out was performed. Patient was placed in dorsolithotomy in [place] stirrups.  A Red Rubber catheter was used to drain her bladder. She was prepped and draped in the usual sterile fashion.. A speculum was placeed in the vagina. The cervix was visualized anteriorly and grasped with a single-tooth tenaculum. Paracervical block was performed with 0.5% bupivicaine with 20 cc injected. Sequential dilation was performed with Fredirick dilators. The hysteroscope was inserted and the endometrial cavity and inspected. There were the above findings noted in the endometrial cavity with both ostia seen. The myosure lite was used to resect the tissue. The hysteroscope  was removed. Sharp curettage was performed in all 4 quadrants. All instruments were removed from the vagina. All instrument, needle and lap counts were correct x2. The patient was awakened and is recovering in stable condition.  Carter Quarry, MD Minimally Invasive Gynecologic Surgery  Obstetrics and Gynecology, [facility] for [facility], [facility] 12/26/2023

## 2023-12-26 NOTE — Anesthesia Procedure Notes (Signed)
 Procedure Name: LMA Insertion Date/Time: 12/26/2023 11:00 AM  Performed by: Elby Raelene SAUNDERS, CRNAPre-anesthesia Checklist: Patient identified, Emergency Drugs available, Suction available and Patient being monitored Patient Re-evaluated:Patient Re-evaluated prior to induction Oxygen Delivery Method: Circle System Utilized Preoxygenation: Pre-oxygenation with 100% oxygen Induction Type: IV induction Ventilation: Mask ventilation without difficulty LMA: LMA inserted LMA Size: 4.0 Number of attempts: 1 Airway Equipment and Method: Bite block Placement Confirmation: positive ETCO2 Tube secured with: Tape Dental Injury: Teeth and Oropharynx as per pre-operative assessment

## 2023-12-26 NOTE — H&P (Signed)
 OB/GYN Pre-Op History and Physical  Monica Duarte is a 68 y.o. No obstetric history on file. presenting for endometrial hyperplasia without atypia.       Past Medical History:  Diagnosis Date   Anxiety    Arthritis    Asthma    Depression    occasional   Diabetes mellitus without complication (HCC)    GERD (gastroesophageal reflux disease)    Hypertension    Obesity    Sleep apnea    does not wear CPAP    Past Surgical History:  Procedure Laterality Date   ectopic     LAPAROSCOPIC GASTRIC BANDING     2009   LEEP     1994   SALPINGECTOMY      OB History  No obstetric history on file.    Social History   Socioeconomic History   Marital status: Divorced    Spouse name: Not on file   Number of children: Not on file   Years of education: Not on file   Highest education level: Not on file  Occupational History   Not on file  Tobacco Use   Smoking status: Never   Smokeless tobacco: Never  Vaping Use   Vaping status: Never Used  Substance and Sexual Activity   Alcohol use: Yes    Comment: Tequilla monthly   Drug use: No   Sexual activity: Not on file  Other Topics Concern   Not on file  Social History Narrative   Not on file   Social Drivers of Health   Financial Resource Strain: Not on file  Food Insecurity: Not on file  Transportation Needs: Not on file  Physical Activity: Not on file  Stress: Not on file  Social Connections: Not on file    Family History  Problem Relation Age of Onset   Hypertension Mother    Diabetes Mother    Colon cancer Neg Hx    Colon polyps Neg Hx    Esophageal cancer Neg Hx    Rectal cancer Neg Hx    Stomach cancer Neg Hx     Medications Prior to Admission  Medication Sig Dispense Refill Last Dose/Taking   finasteride (PROSCAR) 5 MG tablet Take 5 mg by mouth daily.   Taking   hydrochlorothiazide (HYDRODIURIL) 25 MG tablet Take 25 mg by mouth daily.   Taking   megestrol  (MEGACE ) 40 MG tablet Take 1 tablet  (40 mg total) by mouth 2 (two) times daily. Can increase to two tablets twice a day in the event of heavy bleeding 60 tablet 5 Taking   Multiple Vitamin (MULTIVITAMIN WITH MINERALS) TABS tablet Take 1 tablet by mouth daily.   Taking   Pumpkin Seed-Soy Germ (AZO BLADDER CONTROL/GO-LESS PO) Take 1 tablet by mouth in the morning, at noon, and at bedtime.   Taking   rosuvastatin (CRESTOR) 20 MG tablet Take 20 mg by mouth daily.   Taking   triamcinolone  (KENALOG ) 0.025 % cream Apply 1 Application topically daily.   Taking   ACCU-CHEK GUIDE test strip       Accu-Chek Softclix Lancets lancets SMARTSIG:Topical      Blood Glucose Monitoring Suppl (ACCU-CHEK GUIDE) w/Device KIT See admin instructions.      pantoprazole  (PROTONIX ) 20 MG tablet Take 1 tablet (20 mg total) by mouth 2 (two) times daily. (Patient not taking: Reported on 12/20/2023) 30 tablet 0 Not Taking    Allergies  Allergen Reactions   Metformin  And Related Other (See Comments)  Mouth soreness   Sulfa Antibiotics Other (See Comments)    Gives pt mouth sores    Review of Systems: Negative except for what is mentioned in HPI.     Physical Exam: BP 139/73   Pulse 96   Temp 97.8 F (36.6 C) (Oral)   Resp 18   Ht 5' 6 (1.676 m)   Wt 127 kg   LMP 02/09/2014   SpO2 97%   BMI 45.19 kg/m  CONSTITUTIONAL: Well-developed, well-nourished and in no acute distress.  HENT:  Normocephalic, atraumatic, External right and left ear normal. Oropharynx is clear and moist EYES: Conjunctivae and EOM are normal. Pupils are equal, round, and reactive to light. No scleral icterus.  NECK: Normal range of motion, supple, no masses SKIN: Skin is warm and dry. No rash noted. Not diaphoretic. No erythema. No pallor. NEUROLGIC: Alert and oriented to person, place, and time. Normal reflexes, muscle tone coordination. No cranial nerve deficit noted. PSYCHIATRIC: Normal mood and affect. Normal behavior. Normal judgment and thought content. RESPIRATORY:  Normal effort PELVIC: Deferred   Pertinent Labs/Studies:   No results found for this or any previous visit (from the past 72 hours).     Assessment and Plan :Monica Duarte is a 68 y.o. No obstetric history on file. here for operative hysteroscopy.   Patient desires surgical management with operative hysteroscopy of simple hyperplasia without atypica.  The risks of surgery were discussed in detail with the patient including but not limited to: bleeding which may require transfusion or reoperation; infection which may require prolonged hospitalization or re-hospitalization and antibiotic therapy; injury to bowel, bladder, ureters and major vessels or other surrounding organs which may lead to other procedures; formation of adhesions; need for additional procedures including laparotomy or subsequent procedures secondary to intraoperative injury or abnormal pathology; thromboembolic phenomenon; incisional problems and other postoperative or anesthesia complications.  The patient also understands the alternative treatment options which were discussed in full. All questions were answered.    Monica Duarte, M.D. Minimally Invasive Gynecologic Surgery and Pelvic Pain Specialist Attending Obstetrician & Gynecologist, [facility] [employer], [facility]

## 2023-12-26 NOTE — Brief Op Note (Signed)
 12/26/2023  11:23 AM  PATIENT:  Candis FORBES Griffiths  68 y.o. female  PRE-OPERATIVE DIAGNOSIS:  Endometrial hyperplasia without atypia Postmenopusal bleeding  POST-OPERATIVE DIAGNOSIS:  * No post-op diagnosis entered *  PROCEDURE:  Procedure(s): DILATATION AND CURETTAGE /HYSTEROSCOPY (N/A)  SURGEON:  Surgeons and Role:    DEWAINE Jeralyn Crutch, MD - Primary  PHYSICIAN ASSISTANT: n/a  ASSISTANTS: none   ANESTHESIA:   general and paracervical block  EBL:  30 mL   BLOOD ADMINISTERED:none  DRAINS: none   LOCAL MEDICATIONS USED:  BUPIVICAINE   SPECIMEN:  Source of Specimen:  endometrial curettings  DISPOSITION OF SPECIMEN:  PATHOLOGY  COUNTS:  YES  TOURNIQUET:  * No tourniquets in log *  DICTATION: .Note written in EPIC  PLAN OF CARE: Discharge to home after PACU  PATIENT DISPOSITION:  PACU - hemodynamically stable.   Delay start of Pharmacological VTE agent (>24hrs) due to surgical blood loss or risk of bleeding: not applicable

## 2023-12-26 NOTE — Transfer of Care (Signed)
 Immediate Anesthesia Transfer of Care Note  Patient: KAHLAN ENGEBRETSON  Procedure(s) Performed: DILATATION AND CURETTAGE /HYSTEROSCOPY (Vagina )  Patient Location: PACU  Anesthesia Type:General  Level of Consciousness: awake, alert , and oriented  Airway & Oxygen Therapy: Patient Spontanous Breathing  Post-op Assessment: Report given to RN and Post -op Vital signs reviewed and stable  Post vital signs: Reviewed and stable  Last Vitals:  Vitals Value Taken Time  BP 140/84 12/26/23 1200  Temp 36.5 C 12/26/23 1130  Pulse 75 12/26/23 1209  Resp 17 12/26/23 1209  SpO2 95 % 12/26/23 1209  Vitals shown include unfiled device data.  Last Pain:  Vitals:   12/26/23 1200  TempSrc:   PainSc: 0-No pain      Patients Stated Pain Goal: 0 (12/26/23 0956)  Complications: No notable events documented.

## 2023-12-26 NOTE — Anesthesia Postprocedure Evaluation (Signed)
 Anesthesia Post Note  Patient: [name]  Procedure(s) Performed: DILATATION AND CURETTAGE /HYSTEROSCOPY (Vagina )     Patient location during evaluation: PACU Anesthesia Type: General Level of consciousness: awake and alert Pain management: pain level controlled Vital Signs Assessment: post-procedure vital signs reviewed and stable Respiratory status: spontaneous breathing, nonlabored ventilation and respiratory function stable Cardiovascular status: blood pressure returned to baseline and stable Postop Assessment: no apparent nausea or vomiting Anesthetic complications: no  No notable events documented.  Last Vitals:  Vitals:   12/26/23 1145 12/26/23 1200  BP: 126/87 (!) 140/84  Pulse: 69 67  Resp: (!) 21 20  Temp:    SpO2: 97% 99%    Last Pain:  Vitals:   12/26/23 1200  TempSrc:   PainSc: 0-No pain                 Ariauna Farabee,W. EDMOND

## 2023-12-27 ENCOUNTER — Encounter (HOSPITAL_COMMUNITY): Payer: Self-pay | Admitting: Obstetrics and Gynecology

## 2023-12-27 LAB — SURGICAL PATHOLOGY

## 2024-01-23 ENCOUNTER — Other Ambulatory Visit: Payer: Self-pay

## 2024-01-23 ENCOUNTER — Ambulatory Visit (INDEPENDENT_AMBULATORY_CARE_PROVIDER_SITE_OTHER): Payer: 59 | Admitting: Obstetrics and Gynecology

## 2024-01-23 ENCOUNTER — Encounter: Payer: Self-pay | Admitting: Obstetrics and Gynecology

## 2024-01-23 VITALS — BP 142/67 | HR 85 | Wt 297.3 lb

## 2024-01-23 DIAGNOSIS — N8500 Endometrial hyperplasia, unspecified: Secondary | ICD-10-CM | POA: Diagnosis not present

## 2024-01-23 DIAGNOSIS — R61 Generalized hyperhidrosis: Secondary | ICD-10-CM

## 2024-01-23 NOTE — Progress Notes (Unsigned)
   POSTOPERATIVE VISIT NOTE   Subjective:     Monica Duarte is a 68 y.o. No obstetric history on file. who presents to the clinic 4 weeks status post operative hysteroscopy for  endometrial hyperplasia without atypia .   Vaginal bleeding: daily spotting, no pain  Question regarding why cyst wasn't removed; believed bleeding was due to a cyst Resumed sexual acitivity: not currently sexually active but homes to resume at some point and worried it would fee different if she has a hysterectomy Sister had a "partial hysterectomy" - she is going to ask her sister about her hysterectomy surgery and experience.   Feels she is hot all night. Had last menses about 10 years ago. No history of thyroid issues. Feels hot all night and has to turn fan and A/C on.   The following portions of the patient's history were reviewed and updated as appropriate: allergies, current medications, past family history, past medical history, past social history, past surgical history, and problem list..   Review of Systems Pertinent items are noted in HPI.    Objective:    BP (!) 142/67   Pulse 85   Wt 297 lb 4.8 oz (134.9 kg)   LMP 02/09/2014   BMI 47.99 kg/m  General:  alert, cooperative, and no distress  Abdomen: soft, non-tender  Incision:   N/a  Pelvic:   Exam deferred.    Pathology Results: FINAL MICROSCOPIC DIAGNOSIS:   A. ENDOMETRIUM, CURETTAGE:  - Polypoid benign inactive endometrium with hormone-effect  - Negative for hyperplasia or malignancy    Assessment:   {doing well:13525::"Doing well postoperatively."} Operative findings again reviewed. Pathology report discussed.   Plan:    There are no diagnoses linked to this encounter.  Activity restrictions: {restrictions:13723} Anticipated return to work: {work return:14002}. Follow up: ***  Lorriane Shire, MD Obstetrician & Gynecologist, [facility] for [facility], [facility]

## 2024-06-05 ENCOUNTER — Encounter: Payer: Self-pay | Admitting: Physician Assistant

## 2024-06-05 ENCOUNTER — Ambulatory Visit (INDEPENDENT_AMBULATORY_CARE_PROVIDER_SITE_OTHER): Admitting: Physician Assistant

## 2024-06-05 ENCOUNTER — Other Ambulatory Visit (INDEPENDENT_AMBULATORY_CARE_PROVIDER_SITE_OTHER)

## 2024-06-05 DIAGNOSIS — M1712 Unilateral primary osteoarthritis, left knee: Secondary | ICD-10-CM

## 2024-06-05 DIAGNOSIS — M170 Bilateral primary osteoarthritis of knee: Secondary | ICD-10-CM

## 2024-06-05 DIAGNOSIS — M1711 Unilateral primary osteoarthritis, right knee: Secondary | ICD-10-CM

## 2024-06-05 MED ORDER — LIDOCAINE HCL 1 % IJ SOLN
4.0000 mL | INTRAMUSCULAR | Status: AC | PRN
  Administered 2024-06-05: 4 mL

## 2024-06-05 MED ORDER — METHYLPREDNISOLONE ACETATE 40 MG/ML IJ SUSP
40.0000 mg | INTRAMUSCULAR | Status: AC | PRN
  Administered 2024-06-05: 40 mg via INTRA_ARTICULAR

## 2024-06-05 NOTE — Progress Notes (Addendum)
 Office Visit Note   Patient: Monica Duarte           Date of Birth: May 06, 1956           MRN: [id number] Visit Date: 06/05/2024              Requested by: [facility] [address] [place],  [place] [address] PCP: [facility]   Assessment & Plan: Visit Diagnoses:  1. Primary osteoarthritis of both knees     Plan: Pleasant 68 year old woman former patient of Dr. Anderson.  Has a long history of right greater than left osteoarthritis of her knees.  She has had injections in the past and they do help.  She is going on a contract so we will be away for a while.  Was inquiring with regards to what she could use pain wise.  She was hoping she could get a prescription for oral steroids.  I explained to her the long-term effects of these that perhaps she would be better off doing injections again she is agreed to this.  Also discussed surgery with her the recovery.  She would need to weigh about 245 to qualify.  She will concentrate on this and follow-up as needed  Follow-Up Instructions: No follow-ups on file.   Orders:  Orders Placed This Encounter  Procedures   XR KNEE 3 VIEW RIGHT   XR KNEE 3 VIEW LEFT   No orders of the defined types were placed in this encounter.     Procedures: Large Joint Inj: bilateral knee on 06/05/2024 11:30 AM Indications: pain and diagnostic evaluation Details: 25 G 1.5 in needle, anteromedial approach  Arthrogram: No  Medications (Right): 4 mL lidocaine  1 %; 40 mg methylPREDNISolone  acetate 40 MG/ML Medications (Left): 4 mL lidocaine  1 %; 40 mg methylPREDNISolone  acetate 40 MG/ML Outcome: tolerated well, no immediate complications Procedure, treatment alternatives, risks and benefits explained, specific risks discussed. Consent was given by the patient.     Clinical Data: No additional findings.   Subjective:   HPI Patient is a pleasant 68 year old woman who comes in  today with a chief complaint of bilateral knee pain.  She has had this for quite a while.  She has been seen in the past by Dr. Anderson who told her at some point she would need to have surgery.  Review of Systems  All other systems reviewed and are negative.    Objective: Vital Signs: LMP 02/09/2014   Physical Exam Constitutional:      Appearance: Normal appearance.  Pulmonary:     Effort: Pulmonary effort is normal.   Skin:    General: Skin is warm and dry.   Neurological:     General: No focal deficit present.     Mental Status: She is alert and oriented to person, place, and time.   Psychiatric:        Mood and Affect: Mood normal.        Behavior: Behavior normal.    Ortho Exam Bilateral knees no effusion no erythema compartments are soft and compressible she is neurovascularly intact no evidence of cellulitis or infection Specialty Comments:  No specialty comments available.  Imaging: No results found.   PMFS History: Patient Active Problem List   Diagnosis Date Noted   Endometrial hyperplasia without atypia 12/26/2023   Postmenopausal bleeding 12/26/2023   LAP-BAND surgery status 04/06/2022   Trigger finger, left middle finger 06/16/2021   Unilateral primary osteoarthritis,  right knee 06/16/2021   Obesity    High blood pressure 11/22/2020   Chronic right-sided low back pain 03/08/2019   Anxiety 09/21/2018   Stress at home 09/21/2018   Eczema 09/21/2018   Arthritis 09/21/2018   Past Medical History:  Diagnosis Date   Anxiety    Arthritis    Asthma    Depression    occasional   Diabetes mellitus without complication (HCC)    GERD (gastroesophageal reflux disease)    Hypertension    Obesity    Sleep apnea    does not wear CPAP    Family History  Problem Relation Age of Onset   Hypertension Mother    Diabetes Mother    Colon cancer Neg Hx    Colon polyps Neg Hx    Esophageal cancer Neg Hx    Rectal cancer Neg Hx    Stomach cancer Neg Hx      Past Surgical History:  Procedure Laterality Date   ectopic     HYSTEROSCOPY WITH D & C N/A 12/26/2023   Procedure: DILATATION AND CURETTAGE /HYSTEROSCOPY;  Surgeon: Jeralyn Crutch, MD;  Location: [facility] OR;  Service: Gynecology;  Laterality: N/A;   LAPAROSCOPIC GASTRIC BANDING     2009   LEEP     1994   SALPINGECTOMY     Social History   Occupational History   Not on file  Tobacco Use   Smoking status: Never   Smokeless tobacco: Never  Vaping Use   Vaping status: Never Used  Substance and Sexual Activity   Alcohol use: Yes    Comment: Tequilla monthly   Drug use: No   Sexual activity: Not on file

## 2024-06-18 ENCOUNTER — Telehealth: Payer: Self-pay

## 2024-06-18 NOTE — Telephone Encounter (Signed)
 Pt called stating that the medication that was prescribed by Dr. Jeralyn her pharmacy does not have.  Could someone please assist her?  Reynaldo Rossman,RN  06/17/24

## 2024-06-18 NOTE — Telephone Encounter (Signed)
 Returned call to patient. She did not answer. LM for her to call the office back at her convenience to discuss her concerns.

## 2024-07-12 ENCOUNTER — Other Ambulatory Visit: Payer: Self-pay | Admitting: Obstetrics and Gynecology

## 2024-07-12 DIAGNOSIS — N950 Postmenopausal bleeding: Secondary | ICD-10-CM

## 2024-07-12 DIAGNOSIS — N8500 Endometrial hyperplasia, unspecified: Secondary | ICD-10-CM

## 2024-07-12 MED ORDER — NORETHINDRONE ACETATE 5 MG PO TABS: 5.0000 mg | ORAL_TABLET | Freq: Every day | ORAL | 4 refills | Status: AC

## 2024-07-22 ENCOUNTER — Telehealth: Payer: Self-pay | Admitting: Physician Assistant

## 2024-07-22 NOTE — Telephone Encounter (Signed)
 Pt called requesting a refill of prednisone . Please send to [facility] [address]. Pt phone number is [phone]

## 2024-07-22 NOTE — Telephone Encounter (Signed)
 Called patient. No answer. Left voicemail. Advising her that we do not refill prednisone  per [name]. Gave have the callback number if she has any questions.

## 2024-07-24 ENCOUNTER — Encounter (HOSPITAL_COMMUNITY): Payer: Self-pay | Admitting: *Deleted

## 2024-07-24 ENCOUNTER — Ambulatory Visit (HOSPITAL_COMMUNITY): Admission: EM | Admit: 2024-07-24 | Discharge: 2024-07-24 | Disposition: A | Discharge status: Home / Self Care

## 2024-07-24 DIAGNOSIS — M170 Bilateral primary osteoarthritis of knee: Secondary | ICD-10-CM | POA: Diagnosis not present

## 2024-07-24 MED ORDER — ACETAMINOPHEN 500 MG PO TABS: 1000.0000 mg | ORAL_TABLET | Freq: Three times a day (TID) | ORAL | 2 refills | Status: AC

## 2024-07-24 MED ORDER — METHYLPREDNISOLONE 4 MG PO TABS: ORAL_TABLET | ORAL | 0 refills | Status: AC

## 2024-07-24 NOTE — ED Triage Notes (Signed)
 C/O bilat knee (R>L) pain and low back pain onset 4 days ago. Denies injury. States was unable to get in with her ortho. Has been using OTC cream and Tyl Arthritis.

## 2024-07-24 NOTE — ED Provider Notes (Signed)
 [facility]    CSN: [id number] Arrival date & time: 07/24/24  1716    HISTORY   Chief Complaint  Patient presents with   Knee Pain   Back Pain   HPI Monica Duarte is a very pleasant, 68 y.o. female who presents to urgent care today. Patient has known history of primary osteoarthritis in both knees, right more so than the left, which is debilitating for her.  Patient states her pain got significantly worse 4 days ago after attempting to go for a walk with her sister.  Patient denies known injury otherwise.  Patient states that she has been advised she needs a knee replacement but the knee surgeon who advised her this told her he would not perform surgery until she lost a significant amount of weight.  Patient states she is a type II diabetic and has been doing what she can, recently started Mounjaro and has had successful weight loss of 10 pounds so far.  Patient states that when she has had flareups of her arthritis in the past, she has done well with prednisone .  PDMP reviewed, patient has not been prescribed any controlled substances in the past 3 years.  Patient states she is currently living with her sister temporarily which has been difficult for her at times; states her sister often teases her about her knee pain not being real, frequently prepares her meals and desserts that are not diabetic friendly.  Patient states she is motivated to find a place to live on her own and to lose weight.  The history is provided by the patient.  Knee Pain Associated symptoms: back pain   Back Pain  Past Medical History:  Diagnosis Date   Anxiety    Arthritis    Asthma    Depression    occasional   Diabetes mellitus without complication (HCC)    GERD (gastroesophageal reflux disease)    Hypertension    Obesity    Sleep apnea    does not wear CPAP   Patient Active Problem List   Diagnosis Date Noted   Endometrial hyperplasia without atypia 12/26/2023   Postmenopausal  bleeding 12/26/2023   LAP-BAND surgery status 04/06/2022   Trigger finger, left middle finger 06/16/2021   Unilateral primary osteoarthritis, right knee 06/16/2021   Obesity    High blood pressure 11/22/2020   Chronic right-sided low back pain 03/08/2019   Anxiety 09/21/2018   Stress at home 09/21/2018   Eczema 09/21/2018   Arthritis 09/21/2018   Past Surgical History:  Procedure Laterality Date   ectopic     HYSTEROSCOPY WITH D & C N/A 12/26/2023   Procedure: DILATATION AND CURETTAGE /HYSTEROSCOPY;  Surgeon: Jeralyn Crutch, MD;  Location: [facility];  Service: Gynecology;  Laterality: N/A;   LAPAROSCOPIC GASTRIC BANDING     2009   LEEP     1994   SALPINGECTOMY     OB History   No obstetric history on file.    Home Medications    Prior to Admission medications   Medication Sig Start Date End Date Taking? Authorizing Provider  finasteride (PROSCAR) 5 MG tablet Take 5 mg by mouth daily.   Yes [provider]  hydrochlorothiazide (HYDRODIURIL) 25 MG tablet Take 25 mg by mouth daily. 12/06/22  Yes [provider]  MINOXIDIL PO Take by mouth.   Yes [provider]  norethindrone  (AYGESTIN ) 5 MG tablet Take 1 tablet (5 mg total) by mouth daily. 07/12/24  Yes Ajewole, Christana, MD  rosuvastatin (CRESTOR) 20 MG tablet Take 20 mg by mouth daily. Taking 1/2 dose 11/03/23  Yes [provider]  ACCU-CHEK GUIDE test strip  01/20/23   [provider]  Accu-Chek Softclix Lancets lancets SMARTSIG:Topical 01/20/23   [provider]  Blood Glucose Monitoring Suppl (ACCU-CHEK GUIDE) w/Device KIT See admin instructions. 12/19/22   [provider]  ibuprofen  (ADVIL ) 600 MG tablet Take 1 tablet (600 mg total) by mouth every 6 (six) hours as needed. 12/26/23   Ajewole, Christana, MD  megestrol  (MEGACE ) 40 MG tablet Take 1 tablet (40 mg total) by mouth 2 (two) times daily. Can increase to two tablets twice a day in the event of heavy bleeding  10/12/23   Ajewole, Christana, MD  Multiple Vitamin (MULTIVITAMIN WITH MINERALS) TABS tablet Take 1 tablet by mouth daily.    [provider]  pantoprazole  (PROTONIX ) 20 MG tablet Take 1 tablet (20 mg total) by mouth 2 (two) times daily. Patient not taking: Reported on 12/20/2023 06/01/23   Randol Simmonds, MD  Pumpkin Seed-Soy Germ (AZO BLADDER CONTROL/GO-LESS PO) Take 1 tablet by mouth in the morning, at noon, and at bedtime.    [provider]  triamcinolone  (KENALOG ) 0.025 % cream Apply 1 Application topically daily. 10/10/23   [provider]    Family History Family History  Problem Relation Age of Onset   Hypertension Mother    Diabetes Mother    Colon cancer Neg Hx    Colon polyps Neg Hx    Esophageal cancer Neg Hx    Rectal cancer Neg Hx    Stomach cancer Neg Hx    Social History Social History   Tobacco Use   Smoking status: Never   Smokeless tobacco: Never  Vaping Use   Vaping status: Never Used  Substance Use Topics   Alcohol use: Yes    Comment: occasionally   Drug use: No   Allergies   Metformin  and related and Sulfa antibiotics  Review of Systems Review of Systems  Musculoskeletal:  Positive for back pain.   Pertinent findings revealed after performing a 14 point review of systems has been noted in the history of present illness.  Physical Exam Vital Signs BP 109/74   Pulse (!) 109   Temp 98.4 F (36.9 C) (Oral)   Resp (!) 22   LMP 02/09/2014   SpO2 94%   No data found.  Physical Exam Vitals and nursing note reviewed.  Constitutional:      General: She is awake. She is not in acute distress.    Appearance: Normal appearance. She is well-developed and well-groomed. She is not ill-appearing.  Musculoskeletal:     Right knee: Swelling, deformity, bony tenderness and crepitus present.     Left knee: Swelling, deformity, bony tenderness and crepitus present.  Neurological:     Mental Status: She is alert.  Psychiatric:         Behavior: Behavior is cooperative.     UC Couse / Diagnostics / Procedures:     Radiology No results found.  Procedures Procedures (including critical care time) EKG  Pending results:  Labs Reviewed - No data to display  Medications Ordered in UC: Medications - No data to display  UC Diagnoses / Final Clinical Impressions(s)   I have reviewed the triage vital signs and the nursing notes.  Pertinent labs & imaging results that were available during my care of the patient were reviewed by me and considered in my medical decision making (see  chart for details).    Final diagnoses:  Bilateral primary osteoarthritis of knee   EMR reviewed by me, patient has very well-controlled type 2 diabetes, last A1c was 6.5, 9 months ago.  Patient reports compliance with all medications and is willing to exercise but unable to do so secondary to pain.    During visit today, we discussed water  walking and water  aerobics as a great option due to being nonweightbearing.  As a courtesy to patient, I allowed her to view and photograph images of her most recent knee x-rays and then provided her with an image of an x-ray of her normal knee.  I encouraged her to share these with her sister to help her sister understand what she is going through this time.  I further encouraged patient to continue to share mealtimes with her sister but to let her sister know that she will be preparing her own meals which are more diabetic friendly in hopes this will make sheering mealtime more enjoyable for both of them.  Upon further review of EMR, I see that she is also been prescribed prednisone  for arthritis flareup in the past.  Patient provided with prescription from methylprednisolone  in hopes that this will not raise her blood sugars much as prednisone  can, 12-day tapering dose provided.    Because patient's knee pain is so debilitating and significantly limits her physical activities, patient was further  encouraged to consider seeking a second opinion from a different knee surgeon as not all knee surgeons agree that weight loss is absolutely required prior to knee replacement, particularly with motivated patients like Ms. Fraser.   Please see discharge instructions below for details of plan of care as provided to patient. ED Prescriptions     Medication Sig Dispense Auth. Provider   methylPREDNISolone  (MEDROL ) 4 MG tablet Take 4 tablets (16 mg total) by mouth 2 (two) times daily for 3 days, THEN 3 tablets (12 mg total) daily for 3 days, THEN 2 tablets (8 mg total) daily for 3 days, THEN 1 tablet (4 mg total) daily for 3 days. 42 tablet Joesph Shaver Scales, PA-C   acetaminophen  (TYLENOL ) 500 MG tablet Take 2 tablets (1,000 mg total) by mouth every 8 (eight) hours. 180 tablet Joesph Shaver Scales, PA-C      PDMP not reviewed this encounter.    Discharge Instructions      It was a pleasure to meet you today.  I have included some information about osteoarthritis and knee replacements that I hope you find helpful.  I do not agree that it is necessary to lose a significant amount of weight before having your knee replaced, I also think that is unreasonable expectation.  In my honest opinion, I think it would be a good idea for you to get a second opinion with a knee surgeon at another clinic.  Other clinics I personally recommend include [facility], [facility] and [facility].  Please do also reach out to your insurance company to make sure this is not a limitation of your health insurance plan.  As we discussed, I have sent a prescription for methylprednisolone  to your pharmacy.  Please read the dosing instructions carefully.  Also as we discussed, if you do not tolerate taking half of the daily dose every 12 hours (the evening dose is keeping you awake at night), it is perfectly reasonable to take both the morning and evening doses together  in the morning (for example: Instead of taking  4 tablets twice daily, take 8 tablets in the morning).  Ice is a great anti-inflammatory, after exercising or exerting yourself is a good idea to apply ice to your knees to get the swelling and pain under control.  In the evenings, if aching and pain is keeping you awake at night before you go to sleep, applying heat is a great way to promote circulation which may help with the achiness.  As you are already likely aware, drinking plenty of water  will also help address your pain.  The last thing I would like to share with you is that Tylenol  is a great pain reliever.  For most people, they do not get pain relief after taking a single dose.  Sometimes after taking a second dose 8 hours later, they still do not get complete pain relief.  After taking the third dose 8 hours after the second dose, almost everyone has significant pain relief.  I recommend that you take 1000 mg 3 times daily on a regular basis (like a vitamin) to help keep your pain under control.  I have sent a prescription to your pharmacy.  Thank you for visiting [facility] today.  I appreciate the opportunity to participate in your care.      Disposition Upon Discharge:  Condition: stable for discharge home Home: take medications as prescribed; routine discharge instructions as discussed; follow up as advised.  Patient presented with an acute illness with associated systemic symptoms and significant discomfort requiring urgent management. In my opinion, this is a condition that a prudent lay person (someone who possesses an average knowledge of health and medicine) may potentially expect to result in complications if not addressed urgently such as respiratory distress, impairment of bodily function or dysfunction of bodily organs.   Routine symptom specific, illness specific and/or disease specific instructions were discussed with the patient and/or caregiver at length.    As such, the patient has been evaluated and assessed, work-up was performed and treatment was provided in alignment with urgent care protocols and evidence based medicine.  Patient/parent/caregiver has been advised that the patient may require follow up for further testing and treatment if the symptoms continue in spite of treatment, as clinically indicated and appropriate.  Patient/parent/caregiver has been advised to report to orthopedic urgent care clinic or return to the [facility] or PCP in 3-5 days if no better; follow-up with orthopedics, PCP or the Emergency Department if new signs and symptoms develop or if the current signs or symptoms continue to change or worsen for further workup, evaluation and treatment as clinically indicated and appropriate  The patient will follow up with their current PCP if and as advised. If the patient does not currently have a PCP we will have assisted them in obtaining one.   The patient may need specialty follow up if the symptoms continue, in spite of conservative treatment and management, for further workup, evaluation, consultation and treatment as clinically indicated and appropriate.  Patient/parent/caregiver verbalized understanding and agreement of plan as discussed.  All questions were addressed during visit.  Please see discharge instructions below for further details of plan.  This office note has been dictated using Teaching laboratory technician.  Unfortunately, this method of dictation can sometimes lead to typographical or grammatical errors.  I apologize for your inconvenience in advance if this occurs.  Please do not hesitate to reach out to me if clarification is needed.      Joesph Shaver Scales, PA-C 07/26/24 1047

## 2024-07-24 NOTE — Discharge Instructions (Signed)
 It was a pleasure to meet you today.  I have included some information about osteoarthritis and knee replacements that I hope you find helpful.  I do not agree that it is necessary to lose a significant amount of weight before having your knee replaced, I also think that is unreasonable expectation.  In my honest opinion, I think it would be a good idea for you to get a second opinion with a knee surgeon at another clinic.  Other clinics I personally recommend include [facility], [facility] and [facility].  Please do also reach out to your insurance company to make sure this is not a limitation of your health insurance plan.  As we discussed, I have sent a prescription for methylprednisolone  to your pharmacy.  Please read the dosing instructions carefully.  Also as we discussed, if you do not tolerate taking half of the daily dose every 12 hours (the evening dose is keeping you awake at night), it is perfectly reasonable to take both the morning and evening doses together in the morning (for example: Instead of taking 4 tablets twice daily, take 8 tablets in the morning).  Ice is a great anti-inflammatory, after exercising or exerting yourself is a good idea to apply ice to your knees to get the swelling and pain under control.  In the evenings, if aching and pain is keeping you awake at night before you go to sleep, applying heat is a great way to promote circulation which may help with the achiness.  As you are already likely aware, drinking plenty of water  will also help address your pain.  The last thing I would like to share with you is that Tylenol  is a great pain reliever.  For most people, they do not get pain relief after taking a single dose.  Sometimes after taking a second dose 8 hours later, they still do not get complete pain relief.  After taking the third dose 8 hours after the second dose, almost everyone has significant pain relief.  I  recommend that you take 1000 mg 3 times daily on a regular basis (like a vitamin) to help keep your pain under control.  I have sent a prescription to your pharmacy.  Thank you for visiting [facility] today.  I appreciate the opportunity to participate in your care.

## 2024-08-06 ENCOUNTER — Ambulatory Visit: Admitting: Physician Assistant

## 2024-08-09 ENCOUNTER — Ambulatory Visit: Admitting: Physician Assistant

## 2024-08-16 ENCOUNTER — Other Ambulatory Visit: Payer: Self-pay

## 2024-08-16 ENCOUNTER — Ambulatory Visit: Admitting: Physician Assistant

## 2024-08-16 ENCOUNTER — Encounter: Payer: Self-pay | Admitting: Physician Assistant

## 2024-08-16 DIAGNOSIS — G8929 Other chronic pain: Secondary | ICD-10-CM | POA: Diagnosis not present

## 2024-08-16 DIAGNOSIS — M5441 Lumbago with sciatica, right side: Secondary | ICD-10-CM

## 2024-08-16 NOTE — Progress Notes (Signed)
 Office Visit Note   Patient: Monica Duarte           Date of Birth: 1956-08-28           MRN: [id number] Visit Date: 08/16/2024              Requested by: [facility] [address] [place],  [place] [address] PCP: [facility]   Assessment & Plan: Visit Diagnoses:  1. Chronic midline low back pain with right-sided sciatica     Plan: Patient is a pleasant 68 year old woman who comes in today with chronic right knee pain and some low back pain.  Denies any injuries.  She does have a history of tricompartmental arthritis in both of her knees and is working on weight loss.  She has lost 18 pounds.  She thinks she has to lose about 45 more.  Findings consistent with osteoarthritis of her right knee.  We talked about injection and she like to decline that at this time.  I could place her on a course of meloxicam  which she is willing to do.  She knows not to take other anti-inflammatories with this.  She also is coming in complaining of low back pain without any radicular findings.  She has had x-rays in the past as they are today with some facet arthropathy and some slight listhesis at L3-4.  She is willing to do physical therapy her strength is intact I do not see any red flags if she did not get better with PT and her back could consider an MRI and possible ESI  Follow-Up Instructions: Return if symptoms worsen or fail to improve.   Orders:  Orders Placed This Encounter  Procedures   XR Lumbar Spine 2-3 Views   Ambulatory referral to Physical Therapy   No orders of the defined types were placed in this encounter.     Procedures: No procedures performed   Clinical Data: No additional findings.   Subjective: Chief Complaint  Patient presents with   Lower Back - Pain   Right Knee - Pain    HPI patient is a pleasant 68 year old woman comes in today with a chief plaint of right knee pain and low back pain.  No  particular injury has a history of right knee pain.  She has been working on weight loss and is doing well has lost 18 pounds so far.  Also comes in with a history of recent low back pain no loss of bowel or bladder control no radicular findings or weakness  Review of Systems  All other systems reviewed and are negative.    Objective: Vital Signs: LMP 02/09/2014   Physical Exam Constitutional:      Appearance: Normal appearance.  Pulmonary:     Effort: Pulmonary effort is normal.  Skin:    General: Skin is warm and dry.  Neurological:     General: No focal deficit present.     Mental Status: She is alert and oriented to person, place, and time.     Ortho Exam Examination of her low back she has good flexion extension.  Some tenderness to patient patient at L5-S1 but no step-offs.  She has good strength with resisted extension and flexion dorsiflexion plantarflexion of her ankles sensation is intact Examination of the right knee has positive grinding with range of motion good strength with resisted extension and flexion.  Compartments are soft nontender tender more medially than laterally Specialty  Comments:  No specialty comments available.  Imaging: No results found.   PMFS History: Patient Active Problem List   Diagnosis Date Noted   Endometrial hyperplasia without atypia 12/26/2023   Postmenopausal bleeding 12/26/2023   LAP-BAND surgery status 04/06/2022   Trigger finger, left middle finger 06/16/2021   Unilateral primary osteoarthritis, right knee 06/16/2021   Obesity    High blood pressure 11/22/2020   Chronic right-sided low back pain 03/08/2019   Anxiety 09/21/2018   Stress at home 09/21/2018   Eczema 09/21/2018   Arthritis 09/21/2018   Past Medical History:  Diagnosis Date   Anxiety    Arthritis    Asthma    Depression    occasional   Diabetes mellitus without complication (HCC)    GERD (gastroesophageal reflux disease)    Hypertension    Obesity     Sleep apnea    does not wear CPAP    Family History  Problem Relation Age of Onset   Hypertension Mother    Diabetes Mother    Colon cancer Neg Hx    Colon polyps Neg Hx    Esophageal cancer Neg Hx    Rectal cancer Neg Hx    Stomach cancer Neg Hx     Past Surgical History:  Procedure Laterality Date   ectopic     HYSTEROSCOPY WITH D & C N/A 12/26/2023   Procedure: DILATATION AND CURETTAGE /HYSTEROSCOPY;  Surgeon: Jeralyn Crutch, MD;  Location: [facility];  Service: Gynecology;  Laterality: N/A;   LAPAROSCOPIC GASTRIC BANDING     2009   LEEP     1994   SALPINGECTOMY     Social History   Occupational History   Not on file  Tobacco Use   Smoking status: Never   Smokeless tobacco: Never  Vaping Use   Vaping status: Never Used  Substance and Sexual Activity   Alcohol use: Yes    Comment: occasionally   Drug use: No   Sexual activity: Not Currently

## 2024-08-21 ENCOUNTER — Telehealth: Payer: Self-pay | Admitting: Physician Assistant

## 2024-08-21 NOTE — Telephone Encounter (Signed)
 Patient called and said that she was suppose to get some medication called in for her arthritis and never got sent in. CB#[phone]

## 2024-09-02 ENCOUNTER — Other Ambulatory Visit: Payer: Self-pay | Admitting: Physician Assistant

## 2024-09-02 MED ORDER — MELOXICAM 7.5 MG PO TABS: 7.5000 mg | ORAL_TABLET | Freq: Every day | ORAL | 0 refills | Status: DC

## 2024-09-12 ENCOUNTER — Ambulatory Visit: Admitting: Physician Assistant

## 2024-09-12 ENCOUNTER — Encounter: Payer: Self-pay | Admitting: Physician Assistant

## 2024-09-12 DIAGNOSIS — G8929 Other chronic pain: Secondary | ICD-10-CM

## 2024-09-12 DIAGNOSIS — M5441 Lumbago with sciatica, right side: Secondary | ICD-10-CM | POA: Diagnosis not present

## 2024-09-12 MED ORDER — METHYLPREDNISOLONE 4 MG PO TBPK: ORAL_TABLET | ORAL | 0 refills | Status: AC

## 2024-09-12 NOTE — Progress Notes (Signed)
 Office Visit Note   Patient: Monica Duarte           Date of Birth: 20-Aug-1956           MRN: [id number] Visit Date: 09/12/2024              Requested by: [facility] [address] [place],  [place] [address] PCP: Lenon Nell SAILOR, FNP  No chief complaint on file.     HPI: [name] is a pleasant 68 year old woman who have seen in the past for low back pain and bilateral knee pain.  She declined a steroid injection at her last visit.  She says the knees continue to bother her.  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  She is wondering if she could do a Medrol  Dosepak just to calm her symptoms down  Assessment & Plan: Visit Diagnoses:  1. Chronic right-sided low back pain with right-sided sciatica     Plan: I have encouraged her to at least go to physical therapy once or twice to get some home directed exercises and she is willing to do this.  I will call her in a Medrol  Dosepak she knows the long-term effects of steroids.  She has not taken it in a while.  She knows not to take anti-inflammatories and to take this with food.  Would like to reevaluate her in 4 weeks both her back and her knees  Follow-Up Instructions: No follow-ups on file.   Ortho Exam  Patient is alert, oriented, no adenopathy, well-dressed, normal affect, normal respiratory effort. Examination of her bilateral knees right greater than left crepitus with range of motion but strength is intact no redness compartments are soft and nontender Examination of your low back she has no specific pain with forward flexing or bending but just has generalized pain across the bottom of her back no step-offs no flank pain no redness she is neurologically intact    Imaging: No results found. No images are attached to the encounter.  Labs: Lab Results  Component Value Date   HGBA1C 6.5 (H) 10/04/2023    HGBA1C 6.3 (H) 04/07/2022   HGBA1C 6.2 (H) 08/14/2019   REPTSTATUS 10/24/2022 FINAL 10/20/2022   GRAMSTAIN  10/20/2022    RARE WBC PRESENT, PREDOMINANTLY MONONUCLEAR MODERATE GRAM POSITIVE COCCI IN PAIRS FEW GRAM NEGATIVE RODS    CULT  10/20/2022    ABUNDANT ENTEROBACTER AEROGENES ABUNDANT ENTEROCOCCUS FAECALIS NO STAPHYLOCOCCUS AUREUS ISOLATED NO GROUP A STREP (S.PYOGENES) ISOLATED Performed at [facility], [address], [place], [place] [address]    LABORGA ENTEROBACTER AEROGENES 10/20/2022   LABORGA ENTEROCOCCUS FAECALIS 10/20/2022     Lab Results  Component Value Date   ALBUMIN 4.1 04/07/2022   ALBUMIN 3.8 11/24/2021   ALBUMIN 4.3 08/14/2019    No results found for: MG No results found for: VD25OH  No results found for: PREALBUMIN    Latest Ref Rng & Units 12/26/2023   10:18 AM 10/04/2023   10:38 AM 06/01/2023    2:06 PM  CBC EXTENDED  WBC 4.0 - 10.5 K/uL 7.3  7.4  7.7   RBC 3.87 - 5.11 MIL/uL 5.26  5.48  5.12   Hemoglobin 12.0 - 15.0 g/dL 86.3  85.8  86.7   HCT 36.0 - 46.0 % 41.1  44.3  40.5   Platelets 150 - 400 K/uL 329  345  321      There is no height or weight on file to calculate BMI.  Orders:  No orders of the defined types were placed in this encounter.  Meds ordered this encounter  Medications   methylPREDNISolone  (MEDROL  DOSEPAK) 4 MG TBPK tablet    Sig: Take as directed with food    Dispense:  21 tablet    Refill:  0     Procedures: No procedures performed  Clinical Data: No additional findings.  ROS:  All other systems negative, except as noted in the HPI. Review of Systems  Objective: Vital Signs: LMP 02/09/2014   Specialty Comments:  No specialty comments available.  PMFS History: Patient Active Problem List   Diagnosis Date Noted   Endometrial hyperplasia without atypia 12/26/2023   Postmenopausal bleeding 12/26/2023   LAP-BAND surgery status 04/06/2022   Trigger finger, left middle finger 06/16/2021    Unilateral primary osteoarthritis, right knee 06/16/2021   Obesity    High blood pressure 11/22/2020   Chronic right-sided low back pain 03/08/2019   Anxiety 09/21/2018   Stress at home 09/21/2018   Eczema 09/21/2018   Arthritis 09/21/2018   Past Medical History:  Diagnosis Date   Anxiety    Arthritis    Asthma    Depression    occasional   Diabetes mellitus without complication (HCC)    GERD (gastroesophageal reflux disease)    Hypertension    Obesity    Sleep apnea    does not wear CPAP    Family History  Problem Relation Age of Onset   Hypertension Mother    Diabetes Mother    Colon cancer Neg Hx    Colon polyps Neg Hx    Esophageal cancer Neg Hx    Rectal cancer Neg Hx    Stomach cancer Neg Hx     Past Surgical History:  Procedure Laterality Date   ectopic     HYSTEROSCOPY WITH D & C N/A 12/26/2023   Procedure: DILATATION AND CURETTAGE /HYSTEROSCOPY;  Surgeon: Jeralyn Crutch, MD;  Location: [facility] OR;  Service: Gynecology;  Laterality: N/A;   LAPAROSCOPIC GASTRIC BANDING     2009   LEEP     1994   SALPINGECTOMY     Social History   Occupational History   Not on file  Tobacco Use   Smoking status: Never   Smokeless tobacco: Never  Vaping Use   Vaping status: Never Used  Substance and Sexual Activity   Alcohol use: Yes    Comment: occasionally   Drug use: No   Sexual activity: Not Currently

## 2024-09-19 ENCOUNTER — Ambulatory Visit: Admitting: Podiatry

## 2024-09-19 VITALS — Ht 66.0 in | Wt 297.0 lb

## 2024-09-19 DIAGNOSIS — B351 Tinea unguium: Secondary | ICD-10-CM | POA: Diagnosis not present

## 2024-09-19 MED ORDER — ITRACONAZOLE 100 MG PO CAPS: ORAL_CAPSULE | ORAL | 0 refills | Status: AC

## 2024-09-19 MED ORDER — TERBINAFINE HCL 1 % EX CREA: 1.0000 | TOPICAL_CREAM | Freq: Two times a day (BID) | CUTANEOUS | 0 refills | Status: AC

## 2024-09-19 NOTE — Progress Notes (Signed)
  Subjective:  Patient ID: Monica Duarte, female    DOB: 19-May-1956,  MRN: [id number]  Chief Complaint  Patient presents with   Foot Problem    Rm 21 NP-Tinea unguium. Nails are discolored and thickened.    68 y.o. female presents with the above complaint. History confirmed with patient.  Multiple nails have become thickened elongated and discolored over the years, the worst of this is from the right great toe, she has a treatment from her PCP with oral medications as well.  Objective:  Physical Exam: warm, good capillary refill, no trophic changes or ulcerative lesions, normal DP and PT pulses, normal sensory exam, and right hallux onychomycosis with significant dystrophy.  Assessment:   1. Onychomycosis      Plan:  Patient was evaluated and treated and all questions answered.  Discussed further treatment including further oral therapy with Sporanox or fluconazole .  I do not have much success with Lamisil.  We discussed removal of the nail plate allowing it to regrow while on antifungal therapy.  Discussed risk benefits and potential side effects of antifungal therapy.  No history of hepatic or renal disease.  Sporanox pulsed dosing sent to pharmacy.  Following consent and prep with alcohol the right hallux was anesthetized with a digital block lidocaine  and Marcaine  1.5 cc each.  It was prepped with Betadine exsanguinated and secured with a tourniquet around the base of the toe.  The nail plate the right hallux was avulsed with a Therapist, nutritional.  Was irrigated with alcohol and dressed with Silvadene compression bandage.  Follow-up in 4 months to reevaluate after treatment. Return in about 4 months (around 01/20/2025) for follow up after nail fungus treatment.

## 2024-09-20 ENCOUNTER — Telehealth: Payer: Self-pay

## 2024-09-20 NOTE — Telephone Encounter (Signed)
 PA request received from Cover[employer]eds for Itraconazole 100 mg capsule. PA submitted and waiting on response  Kayli Beal  (Key: [id number]) Rx #: [phone]

## 2024-09-23 ENCOUNTER — Telehealth: Payer: Self-pay | Admitting: Lab

## 2024-09-23 NOTE — Telephone Encounter (Signed)
 Patient calling to see if any ointments would be called into her pharmacy she states she thinks provider was to call one in after procedure please advise I didn't see anything in note.

## 2024-09-24 NOTE — Telephone Encounter (Signed)
 Left message

## 2024-09-26 ENCOUNTER — Other Ambulatory Visit: Payer: Self-pay | Admitting: Physician Assistant

## 2024-10-09 NOTE — Therapy (Signed)
 OUTPATIENT PHYSICAL THERAPY THORACOLUMBAR EVALUATION   Patient Name: Monica Duarte MRN: [id number] DOB:1956-01-01, 68 y.o., female Today's Date: 10/11/2024  END OF SESSION:  PT End of Session - 10/10/24 1433     Visit Number 1    Number of Visits 17   1-2x per week   Date for Recertification  12/13/24    Authorization Type [employer]PLETE, MEDICAID OF [place]    Authorization - Visit Number 1    Authorization - Number of Visits 27    PT Start Time 1424    PT Stop Time 1505    PT Time Calculation (min) 41 min    Activity Tolerance Patient tolerated treatment well    Behavior During Therapy WFL for tasks assessed/performed          Past Medical History:  Diagnosis Date   Anxiety    Arthritis    Asthma    Depression    occasional   Diabetes mellitus without complication (HCC)    GERD (gastroesophageal reflux disease)    Hypertension    Obesity    Sleep apnea    does not wear CPAP   Past Surgical History:  Procedure Laterality Date   ectopic     HYSTEROSCOPY WITH D & C N/A 12/26/2023   Procedure: DILATATION AND CURETTAGE /HYSTEROSCOPY;  Surgeon: Jeralyn Crutch, MD;  Location: [facility] OR;  Service: Gynecology;  Laterality: N/A;   LAPAROSCOPIC GASTRIC BANDING     2009   LEEP     1994   SALPINGECTOMY     Patient Active Problem List   Diagnosis Date Noted   Endometrial hyperplasia without atypia 12/26/2023   Postmenopausal bleeding 12/26/2023   LAP-BAND surgery status 04/06/2022   Trigger finger, left middle finger 06/16/2021   Unilateral primary osteoarthritis, right knee 06/16/2021   Obesity    High blood pressure 11/22/2020   Chronic right-sided low back pain 03/08/2019   Anxiety 09/21/2018   Stress at home 09/21/2018   Eczema 09/21/2018   Arthritis 09/21/2018    PCP: Lenon Nell SAILOR, FNP   REFERRING PROVIDER: Persons, [name], PA   REFERRING DIAG: [phone] (ICD-10-CM) - Chronic midline low back pain with right-sided  sciatica   Rationale for Evaluation and Treatment: Rehabilitation  THERAPY DIAG:  Other low back pain  Muscle weakness (generalized)  Difficulty in walking, not elsewhere classified  ONSET DATE: Chronic  SUBJECTIVE:                                                                                                                                                                                           SUBJECTIVE STATEMENT: Pt  reports a chronic Hx of low back pain associated primarily with prolonged standing and walking. Pain can limit both within 5 mins. Pt also notes R knee pain impacts her standing and walking.  PERTINENT HISTORY:  High BMI, DM  PAIN:  Are you having pain? Yes: NPRS scale: 0-8/10 Pain location: Midline low back Pain description: Ache, sore Aggravating factors: Standing and walking (5 mins) Relieving factors: Sitting  PRECAUTIONS: None  RED FLAGS: None   WEIGHT BEARING RESTRICTIONS: No  FALLS:  Has patient fallen in last 6 months? No  LIVING ENVIRONMENT: Lives with: lives with their family Lives in: House/apartment Stairs: Yes: External: 15 steps; on right going up Has following equipment at home: Single point cane  OCCUPATION: Retired  PLOF: Independent with household mobility without device and Independent with community mobility with device  PATIENT GOALS: less pain better tolerance   OBJECTIVE:  Note: Objective measures were completed at Evaluation unless otherwise noted.  DIAGNOSTIC FINDINGS:  She has had x-rays in the past as they are today with some facet arthropathy and some slight listhesis at L3-4.   PATIENT SURVEYS:  Modified Oswestry: 25/50= 50%  Interpretation of scores: Score Category Description  0-20% Minimal Disability The patient can cope with most living activities. Usually no treatment is indicated apart from advice on lifting, sitting and exercise  21-40% Moderate Disability The patient experiences more pain and  difficulty with sitting, lifting and standing. Travel and social life are more difficult and they may be disabled from work. Personal care, sexual activity and sleeping are not grossly affected, and the patient can usually be managed by conservative means  41-60% Severe Disability Pain remains the main problem in this group, but activities of daily living are affected. These patients require a detailed investigation  61-80% Crippled Back pain impinges on all aspects of the patient's life. Positive intervention is required  81-100% Bed-bound These patients are either bed-bound or exaggerating their symptoms  Minimally Clinically Important Difference (MCID) = 12.8%  COGNITION: Overall cognitive status: Within functional limits for tasks assessed     SENSATION: WFL  MUSCLE LENGTH: Hamstrings: Right tight deg; Left tight deg Thomas test: Right tight deg; Left tight deg  POSTURE: rounded shoulders, forward head, decreased lumbar lordosis, and anterior pelvic tilt  PALPATION: Not TTP to the low back  LUMBAR ROM:  Pain was not provoked with trunk movements AROM eval  Flexion Full  Extension Full  Right lateral flexion Full  Left lateral flexion Full  Right rotation Full  Left rotation Full   (Blank rows = not tested)  LOWER EXTREMITY ROM:     Active  Right eval Left eval  Hip flexion    Hip extension    Hip abduction    Hip adduction    Hip internal rotation    Hip external rotation    Knee flexion 85 85  Knee extension    Ankle dorsiflexion    Ankle plantarflexion    Ankle inversion    Ankle eversion     (Blank rows = not tested)  LOWER EXTREMITY MMT:   Weak core MMT Right eval Left eval  Hip flexion 4- 4-  Hip extension 3 3  Hip abduction 4- 4-  Hip adduction    Hip internal rotation    Hip external rotation 4- 4-  Knee flexion 4 4  Knee extension 4 4  Ankle dorsiflexion    Ankle plantarflexion    Ankle inversion    Ankle eversion     (  Blank rows = not  tested)  LUMBAR SPECIAL TESTS:  Straight leg raise test: Negative, Slump test: Negative, and SI Compression/distraction test: Negative  FUNCTIONAL TESTS:  5 times sit to stand: TBA  GAIT: Distance walked: 200' Assistive device utilized: None Level of assistance: Complete Independence Comments: Antalgic gait pattern over the R LE  TREATMENT DATE:  [facility] Adult PT Treatment:                                                DATE: 10/10/24 Therapeutic Exercise: Developed, instructed in, and pt completed therex as noted in HEP                                                                                                                         PATIENT EDUCATION:  Education details: Eval findings, POC, HEP, self care-purpose of PT Person educated: Patient Education method: Explanation, Demonstration, Tactile cues, Verbal cues, and Handouts Education comprehension: verbalized understanding, returned demonstration, verbal cues required, and tactile cues required  HOME EXERCISE PROGRAM: Access Code: [email or web address] URL: [email or web address] Date: 10/11/2024 Prepared by: Dasie Daft  Exercises - Supine Transversus Abdominis Bracing - Hands on Stomach  - 1 x daily - 7 x weekly - 2 sets - 10 reps - 1 hold - Supine Bridge  - 1 x daily - 7 x weekly - 2 sets - 10 reps - 3 hold - Supine Hip Adduction Isometric with Ball  - 1 x daily - 7 x weekly - 2 sets - 10 reps - 5 hold - Hooklying Clamshell with Resistance  - 1 x daily - 7 x weekly - 2 sets - 10 reps - 5 hold  ASSESSMENT:  CLINICAL IMPRESSION: Patient is a 68 y.o. female who was seen today for physical therapy evaluation and treatment for M54.41,G89.29 (ICD-10-CM) - Chronic midline low back pain with right-sided sciatica. Pt presents with chronic LBP which becomes significant with prolonged standing and walking, and is relieved with sitting. Deficits included increased lumbar lordosis and ant pelvic tilt, and LE and core weakness. Pt  will benefit from skilled PT for 1 to 2x per week for 8 weeks to address impairments to optimize back function with less pain.   OBJECTIVE IMPAIRMENTS: decreased activity tolerance, decreased mobility, difficulty walking, decreased ROM, decreased strength, postural dysfunction, pain, and high BMI.   ACTIVITY LIMITATIONS: carrying, lifting, bending, standing, squatting, stairs, transfers, locomotion level, and caring for others  PARTICIPATION LIMITATIONS: meal prep, cleaning, laundry, and shopping  PERSONAL FACTORS: Fitness, Past/current experiences, Time since onset of injury/illness/exacerbation, and 1-2 comorbidities: high BMI, DM are also affecting patient's functional outcome.   REHAB POTENTIAL: Good  CLINICAL DECISION MAKING: Evolving/moderate complexity  EVALUATION COMPLEXITY: Moderate   GOALS:  SHORT TERM GOALS: Target date: 11/01/24  Pt will be Ind in an initial HEP  Baseline: started Goal status: INITIAL  2.  Pt will report 25% or better improvement in her low back pain with daily activities Baseline: 0-8/10 Goal status: INITIAL   LONG TERM GOALS: Target date: 12/13/24  Pt will be Ind in a final HEP to maintain achieved LOF  Baseline: started Goal status: INITIAL  2.  Pt will report 50% or better improvement in her low back pain with daily activities Baseline: 0-8/10 Goal status: INITIAL  3.  Pt will report standing and walking tolerance is increased from 5 mins to for improved ability to complete daily activities  Baseline: 5 mins Goal status: INITIAL  4.  Improve 5xSTS by MCID of 5 as indication of improved functional mobility Baseline: TBA Goal status: INITIAL  5.  Pt's Mod Oswestry score will improve by the MCID to 37% as indication of improved function  Baseline: 50% Goal status: INITIAL   PLAN:  PT FREQUENCY: 1-2x/week  PT DURATION: 8 weeks  PLANNED INTERVENTIONS: 97164- PT Re-evaluation, 97110-Therapeutic exercises, 97530- Therapeutic  activity, 97112- Neuromuscular re-education, 97535- Self Care, 02859- Manual therapy, [id number]- Gait training, [phone]- Aquatic Therapy, [phone]- Electrical stimulation (unattended), Patient/Family education, Taping, Joint mobilization, Spinal mobilization, Cryotherapy, and Moist heat.  PLAN FOR NEXT SESSION: Assess 5xSTS; assess response to HEP; progress therex as indicated; use of modalities, manual therapy; and TPDN as indicated.   Kayliah Tindol MS, PT 10/12/24 9:07 AM

## 2024-10-10 ENCOUNTER — Ambulatory Visit: Attending: Physician Assistant

## 2024-10-10 ENCOUNTER — Other Ambulatory Visit: Payer: Self-pay

## 2024-10-10 DIAGNOSIS — M6281 Muscle weakness (generalized): Secondary | ICD-10-CM | POA: Diagnosis present

## 2024-10-10 DIAGNOSIS — M5459 Other low back pain: Secondary | ICD-10-CM | POA: Diagnosis present

## 2024-10-10 DIAGNOSIS — R262 Difficulty in walking, not elsewhere classified: Secondary | ICD-10-CM | POA: Diagnosis present

## 2024-10-10 DIAGNOSIS — G8929 Other chronic pain: Secondary | ICD-10-CM | POA: Diagnosis not present

## 2024-10-10 DIAGNOSIS — M5441 Lumbago with sciatica, right side: Secondary | ICD-10-CM | POA: Insufficient documentation

## 2024-10-11 ENCOUNTER — Ambulatory Visit: Admitting: Physician Assistant

## 2024-10-14 ENCOUNTER — Encounter: Payer: Self-pay | Admitting: Radiology

## 2024-10-18 ENCOUNTER — Ambulatory Visit: Admitting: Physician Assistant

## 2024-10-22 NOTE — Telephone Encounter (Signed)
 I lvm for patient to call back to r/s 10/24/24 appt. Per she requested to r/s.

## 2024-10-24 ENCOUNTER — Ambulatory Visit

## 2024-10-28 ENCOUNTER — Ambulatory Visit: Attending: Physician Assistant | Admitting: Physical Therapy

## 2024-10-28 ENCOUNTER — Encounter: Payer: Self-pay | Admitting: Physical Therapy

## 2024-10-28 DIAGNOSIS — R262 Difficulty in walking, not elsewhere classified: Secondary | ICD-10-CM | POA: Insufficient documentation

## 2024-10-28 DIAGNOSIS — M6281 Muscle weakness (generalized): Secondary | ICD-10-CM | POA: Insufficient documentation

## 2024-10-28 DIAGNOSIS — M5459 Other low back pain: Secondary | ICD-10-CM | POA: Diagnosis present

## 2024-10-28 NOTE — Therapy (Signed)
 OUTPATIENT PHYSICAL THERAPY THORACOLUMBAR EVALUATION   Patient Name: Monica Duarte MRN: [id number] DOB:17-Nov-1956, 68 y.o., female Today's Date: 10/28/2024  END OF SESSION:  PT End of Session - 10/28/24 1502     Visit Number 2    Number of Visits 17   1-2x per week   Date for Recertification  12/13/24    Authorization Type [employer], MEDICAID [place]    Authorization - Visit Number 2    Authorization - Number of Visits 27    PT Start Time 1500    PT Stop Time 1541    PT Time Calculation (min) 41 min    Activity Tolerance Patient tolerated treatment well    Behavior During Therapy WFL for tasks assessed/performed          Past Medical History:  Diagnosis Date   Anxiety    Arthritis    Asthma    Depression    occasional   Diabetes mellitus without complication (HCC)    GERD (gastroesophageal reflux disease)    Hypertension    Obesity    Sleep apnea    does not wear CPAP   Past Surgical History:  Procedure Laterality Date   ectopic     HYSTEROSCOPY WITH D & C N/A 12/26/2023   Procedure: DILATATION AND CURETTAGE /HYSTEROSCOPY;  Surgeon: Jeralyn Crutch, MD;  Location: [facility] OR;  Service: Gynecology;  Laterality: N/A;   LAPAROSCOPIC GASTRIC BANDING     2009   LEEP     1994   SALPINGECTOMY     Patient Active Problem List   Diagnosis Date Noted   Endometrial hyperplasia without atypia 12/26/2023   Postmenopausal bleeding 12/26/2023   LAP-BAND surgery status 04/06/2022   Trigger finger, left middle finger 06/16/2021   Unilateral primary osteoarthritis, right knee 06/16/2021   Obesity    High blood pressure 11/22/2020   Chronic right-sided low back pain 03/08/2019   Anxiety 09/21/2018   Stress at home 09/21/2018   Eczema 09/21/2018   Arthritis 09/21/2018    PCP: Lenon Nell SAILOR, FNP   REFERRING PROVIDER: Persons, [name], PA   REFERRING DIAG: [phone] (ICD-10-CM) - Chronic midline low back pain with right-sided  sciatica   Rationale for Evaluation and Treatment: Rehabilitation  THERAPY DIAG:  Other low back pain  Muscle weakness (generalized)  Difficulty in walking, not elsewhere classified  ONSET DATE: Chronic  SUBJECTIVE:                                                                                                                                                                                           SUBJECTIVE STATEMENT: Pt  reports that she completed a detox and feels this helped with the swelling in her knees and ankles.  PERTINENT HISTORY:  High BMI, DM  PAIN:  Are you having pain? Yes: NPRS scale: 0-8/10 Pain location: Midline low back Pain description: Ache, sore Aggravating factors: Standing and walking (5 mins) Relieving factors: Sitting  PRECAUTIONS: None  RED FLAGS: None   WEIGHT BEARING RESTRICTIONS: No  FALLS:  Has patient fallen in last 6 months? No  LIVING ENVIRONMENT: Lives with: lives with their family Lives in: House/apartment Stairs: Yes: External: 15 steps; on right going up Has following equipment at home: Single point cane  OCCUPATION: Retired  PLOF: Independent with household mobility without device and Independent with community mobility with device  PATIENT GOALS: less pain better tolerance   OBJECTIVE:  Note: Objective measures were completed at Evaluation unless otherwise noted.  DIAGNOSTIC FINDINGS:  She has had x-rays in the past as they are today with some facet arthropathy and some slight listhesis at L3-4.   PATIENT SURVEYS:  Modified Oswestry: 25/50= 50%  Interpretation of scores: Score Category Description  0-20% Minimal Disability The patient can cope with most living activities. Usually no treatment is indicated apart from advice on lifting, sitting and exercise  21-40% Moderate Disability The patient experiences more pain and difficulty with sitting, lifting and standing. Travel and social life are more difficult and they  may be disabled from work. Personal care, sexual activity and sleeping are not grossly affected, and the patient can usually be managed by conservative means  41-60% Severe Disability Pain remains the main problem in this group, but activities of daily living are affected. These patients require a detailed investigation  61-80% Crippled Back pain impinges on all aspects of the patient's life. Positive intervention is required  81-100% Bed-bound These patients are either bed-bound or exaggerating their symptoms  Minimally Clinically Important Difference (MCID) = 12.8%  COGNITION: Overall cognitive status: Within functional limits for tasks assessed     SENSATION: WFL  MUSCLE LENGTH: Hamstrings: Right tight deg; Left tight deg Thomas test: Right tight deg; Left tight deg  POSTURE: rounded shoulders, forward head, decreased lumbar lordosis, and anterior pelvic tilt  PALPATION: Not TTP to the low back  LUMBAR ROM:  Pain was not provoked with trunk movements AROM eval  Flexion Full  Extension Full  Right lateral flexion Full  Left lateral flexion Full  Right rotation Full  Left rotation Full   (Blank rows = not tested)  LOWER EXTREMITY ROM:     Active  Right eval Left eval  Hip flexion    Hip extension    Hip abduction    Hip adduction    Hip internal rotation    Hip external rotation    Knee flexion 85 85  Knee extension    Ankle dorsiflexion    Ankle plantarflexion    Ankle inversion    Ankle eversion     (Blank rows = not tested)  LOWER EXTREMITY MMT:   Weak core MMT Right eval Left eval  Hip flexion 4- 4-  Hip extension 3 3  Hip abduction 4- 4-  Hip adduction    Hip internal rotation    Hip external rotation 4- 4-  Knee flexion 4 4  Knee extension 4 4  Ankle dorsiflexion    Ankle plantarflexion    Ankle inversion    Ankle eversion     (Blank rows = not tested)  LUMBAR SPECIAL TESTS:  Straight leg raise test:  Negative, Slump test: Negative, and SI  Compression/distraction test: Negative  FUNCTIONAL TESTS:  5 times sit to stand: TBA 11/17: 14''  GAIT: Distance walked: 200' Assistive device utilized: None Level of assistance: Complete Independence Comments: Antalgic gait pattern over the R LE  TREATMENT DATE:   [facility] Adult PT Treatment  10/28/2024:  Therapeutic Exercise: nu-step L5 38m while taking subjective and planning session with patient PPT - 5'' hold - 2x10 Bridge - 5'' hold - 2x5 Alternating clam - black band - 10x ea Pilates ring squeeze - 3'' hold - x10 SAQ - 3'' hold - 2x10 Sciatic nerve glide - R STS - 3x5 ea    HOME EXERCISE PROGRAM: Access Code: [email or web address] URL: [email or web address] Date: 10/28/2024 Prepared by: Helene Gasmen  Exercises - Supine Transversus Abdominis Bracing - Hands on Stomach  - 1 x daily - 7 x weekly - 2 sets - 10 reps - 1 hold - Supine Bridge  - 1 x daily - 7 x weekly - 2 sets - 10 reps - 3 hold - Supine Hip Adduction Isometric with Ball  - 1 x daily - 7 x weekly - 2 sets - 10 reps - 5 hold - Hooklying Clamshell with Resistance  - 1 x daily - 7 x weekly - 2 sets - 10 reps - 5 hold - Sit to Stand Without Arm Support  - 1 x daily - 4-7 x weekly - 2 sets - 5 reps  ASSESSMENT:  CLINICAL IMPRESSION:  10/28/2024:  Candis tolerated session well with no adverse reaction.  Concentrated on quad and core strength with basic mat exercises.  Pt reports fatigue with minimal increase in pain.  Updated HEP to include STS.  EVAL:  Patient is a 68 y.o. female who was seen today for physical therapy evaluation and treatment for M54.41,G89.29 (ICD-10-CM) - Chronic midline low back pain with right-sided sciatica. Pt presents with chronic LBP which becomes significant with prolonged standing and walking, and is relieved with sitting. Deficits included increased lumbar lordosis and ant pelvic tilt, and LE and core weakness. Pt will benefit from skilled PT for 1 to 2x per week for 8 weeks to  address impairments to optimize back function with less pain.   OBJECTIVE IMPAIRMENTS: decreased activity tolerance, decreased mobility, difficulty walking, decreased ROM, decreased strength, postural dysfunction, pain, and high BMI.   ACTIVITY LIMITATIONS: carrying, lifting, bending, standing, squatting, stairs, transfers, locomotion level, and caring for others  PARTICIPATION LIMITATIONS: meal prep, cleaning, laundry, and shopping  PERSONAL FACTORS: Fitness, Past/current experiences, Time since onset of injury/illness/exacerbation, and 1-2 comorbidities: high BMI, DM are also affecting patient's functional outcome.   REHAB POTENTIAL: Good  CLINICAL DECISION MAKING: Evolving/moderate complexity  EVALUATION COMPLEXITY: Moderate   GOALS:  SHORT TERM GOALS: Target date: 11/01/24  Pt will be Ind in an initial HEP  Baseline: started Goal status: INITIAL  2.  Pt will report 25% or better improvement in her low back pain with daily activities Baseline: 0-8/10 Goal status: INITIAL   LONG TERM GOALS: Target date: 12/13/24  Pt will be Ind in a final HEP to maintain achieved LOF  Baseline: started Goal status: INITIAL  2.  Pt will report 50% or better improvement in her low back pain with daily activities Baseline: 0-8/10 Goal status: INITIAL  3.  Pt will report standing and walking tolerance is increased from 5 mins to for improved ability to complete daily activities  Baseline: 5 mins Goal status: INITIAL  4.  Improve 5xSTS by  MCID of 5 as indication of improved functional mobility Baseline: TBA Goal status: INITIAL  5.  Pt's Mod Oswestry score will improve by the MCID to 37% as indication of improved function  Baseline: 50% Goal status: INITIAL   PLAN:  PT FREQUENCY: 1-2x/week  PT DURATION: 8 weeks  PLANNED INTERVENTIONS: 97164- PT Re-evaluation, 97110-Therapeutic exercises, 97530- Therapeutic activity, 97112- Neuromuscular re-education, 97535- Self Care,  97140- Manual therapy, [id number]- Gait training, 02886- Aquatic Therapy, [phone]- Electrical stimulation (unattended), Patient/Family education, Taping, Joint mobilization, Spinal mobilization, Cryotherapy, and Moist heat.  PLAN FOR NEXT SESSION: Assess 5xSTS; assess response to HEP; progress therex as indicated; use of modalities, manual therapy; and TPDN as indicated.   Anniebelle Devore E Kierra Jezewski PT 10/28/24 3:46 PM

## 2024-10-30 ENCOUNTER — Other Ambulatory Visit: Payer: Self-pay | Admitting: Physician Assistant

## 2024-10-31 ENCOUNTER — Ambulatory Visit: Admitting: Physical Therapy

## 2024-11-13 ENCOUNTER — Other Ambulatory Visit: Payer: Self-pay

## 2024-11-13 ENCOUNTER — Encounter (HOSPITAL_COMMUNITY): Payer: Self-pay

## 2024-11-13 ENCOUNTER — Emergency Department (HOSPITAL_COMMUNITY)
Admission: EM | Admit: 2024-11-13 | Discharge: 2024-11-13 | Disposition: A | Discharge status: Home / Self Care | Attending: Emergency Medicine | Admitting: Emergency Medicine

## 2024-11-13 DIAGNOSIS — J45909 Unspecified asthma, uncomplicated: Secondary | ICD-10-CM | POA: Insufficient documentation

## 2024-11-13 DIAGNOSIS — I10 Essential (primary) hypertension: Secondary | ICD-10-CM | POA: Insufficient documentation

## 2024-11-13 DIAGNOSIS — M25561 Pain in right knee: Secondary | ICD-10-CM | POA: Insufficient documentation

## 2024-11-13 DIAGNOSIS — Z79899 Other long term (current) drug therapy: Secondary | ICD-10-CM | POA: Insufficient documentation

## 2024-11-13 DIAGNOSIS — M25562 Pain in left knee: Secondary | ICD-10-CM | POA: Insufficient documentation

## 2024-11-13 DIAGNOSIS — E119 Type 2 diabetes mellitus without complications: Secondary | ICD-10-CM | POA: Diagnosis not present

## 2024-11-13 DIAGNOSIS — M545 Low back pain, unspecified: Secondary | ICD-10-CM | POA: Insufficient documentation

## 2024-11-13 MED ORDER — NAPROXEN 500 MG PO TABS: 500.0000 mg | ORAL_TABLET | Freq: Two times a day (BID) | ORAL | 0 refills | Status: AC

## 2024-11-13 MED ORDER — DICLOFENAC SODIUM 1 % EX GEL: 2.0000 g | Freq: Four times a day (QID) | CUTANEOUS | 0 refills | Status: AC

## 2024-11-13 NOTE — ED Triage Notes (Signed)
 Pt came in POV states she recently had a StemWave treatment for back & knee pain relief & is currently experiencing worsening symptoms after treatment. Pt got Tx 3 weeks ago; pain has worsen, states she is unable to do any physical activity. Pain 8/10 back & bilateral knees.

## 2024-11-13 NOTE — ED Provider Notes (Signed)
 [place] EMERGENCY DEPARTMENT AT [facility] Provider Note   CSN: [id number] Arrival date & time: 11/13/24  1729     Patient presents with: Knee Pain (b) and Back Pain   Monica Duarte is a 68 y.o. female. Bilateral knee pain since stemwave radiation tx for past three weeks. Waking up from sleep 2/2 pain. Pain present for years Last saw 2 mo ago. Recommended steroid shots and pt but refused shots. Pt twice a week No recent falls Been taking ginger and tumeric, ibuprofen  wo relief  {Add pertinent medical, surgical, social history, OB history to HPI:32947}  Knee Pain Associated symptoms: back pain   Back Pain      Prior to Admission medications   Medication Sig Start Date End Date Taking? Authorizing Provider  ACCU-CHEK GUIDE test strip  01/20/23   [provider]  Accu-Chek Softclix Lancets lancets SMARTSIG:Topical 01/20/23   [provider]  Blood Glucose Monitoring Suppl (ACCU-CHEK GUIDE) w/Device KIT See admin instructions. 12/19/22   [provider]  finasteride (PROSCAR) 5 MG tablet Take 5 mg by mouth daily.    [provider]  hydrochlorothiazide (HYDRODIURIL) 25 MG tablet Take 25 mg by mouth daily. 12/06/22   [provider]  ibuprofen  (ADVIL ) 600 MG tablet Take 1 tablet (600 mg total) by mouth every 6 (six) hours as needed. 12/26/23   Ajewole, Christana, MD  itraconazole  (SPORANOX ) 100 MG capsule Take twice a day for 7 days, then do not take for 3 weeks. Then repeat twice daily x7 days, then do not take for 3 weeks. Repeat final course twice daily x7 days then stop taking. 09/19/24   Silva [name], DPM  meloxicam  (MOBIC ) 7.5 MG tablet Take 1 tablet by mouth once daily 09/26/24   Persons, [name], [place]  methylPREDNISolone  (MEDROL  DOSEPAK) 4 MG TBPK tablet Take as directed with food 09/12/24   Persons, [name], PA  MINOXIDIL PO Take by mouth.    [provider]  Multiple Vitamin (MULTIVITAMIN WITH  MINERALS) TABS tablet Take 1 tablet by mouth daily.    [provider]  norethindrone  (AYGESTIN ) 5 MG tablet Take 1 tablet (5 mg total) by mouth daily. 07/12/24   Ajewole, Christana, MD  Pumpkin Seed-Soy Germ (AZO BLADDER CONTROL/GO-LESS PO) Take 1 tablet by mouth in the morning, at noon, and at bedtime.    [provider]  Roflumilast (ZORYVE) 0.3 % CREA APPLY TO THE AFFECTED AREA(S) BY TOPICAL ROUTE ONCE DAILY    [provider]  rosuvastatin (CRESTOR) 20 MG tablet Take 20 mg by mouth daily. Taking 1/2 dose 11/03/23   [provider]  terbinafine  (LAMISIL ) 1 % cream Apply 1 Application topically 2 (two) times daily. 09/19/24   McDonald, [name], DPM  triamcinolone  (KENALOG ) 0.025 % cream Apply 1 Application topically daily. 10/10/23   [provider]    Allergies: Metformin  and related and Sulfa antibiotics    Review of Systems  Musculoskeletal:  Positive for back pain.    Updated Vital Signs BP 112/80 (BP Location: Left Arm)   Pulse (!) 101   Temp 97.7 F (36.5 C) (Oral)   Resp 16   Ht 5' 6 (1.676 m)   Wt 134.7 kg   LMP 02/09/2014   SpO2 100%   BMI 47.93 kg/m   Physical Exam  (all labs ordered are listed, but only abnormal results are displayed) Labs Reviewed - No data to display  EKG: None  Radiology: No results found.  {  Document cardiac monitor, telemetry assessment procedure when appropriate:32947} Procedures   Medications Ordered in the ED - No data to display    {Click here for ABCD2, HEART and other calculators REFRESH Note before signing:1}                              Medical Decision Making  ***  {Document critical care time when appropriate  Document review of labs and clinical decision tools ie CHADS2VASC2, etc  Document your independent review of radiology images and any outside records  Document your discussion with family members, caretakers and with consultants  Document social determinants of health  affecting pt's care  Document your decision making why or why not admission, treatments were needed:32947:::1}   Final diagnoses:  None    ED Discharge Orders     None

## 2024-11-13 NOTE — Discharge Instructions (Signed)
 Please follow-up with orthopedic specialist.  I have sent naproxen  and Voltaren  gel to your pharmacy.  Try to elevate legs use ice, heat for pain.  Return to Emergency Department for experience significant swelling to legs or pain, worsening symptoms

## 2024-11-22 ENCOUNTER — Ambulatory Visit: Admitting: Physician Assistant

## 2024-11-22 VITALS — Ht 66.0 in | Wt 297.0 lb

## 2024-11-22 DIAGNOSIS — M1711 Unilateral primary osteoarthritis, right knee: Secondary | ICD-10-CM | POA: Diagnosis not present

## 2024-11-22 MED ORDER — LIDOCAINE HCL 1 % IJ SOLN
4.0000 mL | INTRAMUSCULAR | Status: AC | PRN
  Administered 2024-11-22: 4 mL

## 2024-11-22 MED ORDER — METHYLPREDNISOLONE ACETATE 40 MG/ML IJ SUSP
40.0000 mg | INTRAMUSCULAR | Status: AC | PRN
  Administered 2024-11-22: 40 mg via INTRA_ARTICULAR

## 2024-11-22 NOTE — Progress Notes (Signed)
 Office Visit Note   Patient: Monica Duarte           Date of Birth: [birth year]           MRN: [id number] Visit Date: 11/22/2024              Requested by: Lenon Nell SAILOR, FNP [address] [place],  [place] [address] PCP: Lenon Nell SAILOR, FNP      HPI: Patient is a pleasant 68 year old woman who comes in today with bilateral right greater than left knee pain I have seen her in the past she has had chronic pain of her knees secondary to osteoarthritis.  The pain was bad enough that she went to the emergency room no fever chills or other issues.  She is a diabetic but fairly well-controlled with a hemoglobin A1c of under 7.  She do her BMI currently is almost 48.  Assessment & Plan: Visit Diagnoses:  1. Unilateral primary osteoarthritis, right knee     Plan: I had a long discussion with her today.  I think she would be a good candidate for knee replacement but she would need to lose about 45 to 50 pounds.  She would I offered her referral she is going to talk to her primary care in the meantime I offered her an injection she would like to try 1 injection into the right knee  Follow-Up Instructions: Return if symptoms worsen or fail to improve.   Ortho Exam  Patient is alert, oriented, no adenopathy, well-dressed, normal affect, normal respiratory effort. Right knee no effusion no erythema compartments are soft and compressible neurovascular intact    Imaging: No results found. No images are attached to the encounter.  Labs: Lab Results  Component Value Date   HGBA1C 6.5 (H) 10/04/2023   HGBA1C 6.3 (H) 04/07/2022   HGBA1C 6.2 (H) 08/14/2019   REPTSTATUS 10/24/2022 FINAL 10/20/2022   GRAMSTAIN  10/20/2022    RARE WBC PRESENT, PREDOMINANTLY MONONUCLEAR MODERATE GRAM POSITIVE COCCI IN PAIRS FEW GRAM NEGATIVE RODS    CULT  10/20/2022    ABUNDANT ENTEROBACTER AEROGENES ABUNDANT ENTEROCOCCUS FAECALIS NO STAPHYLOCOCCUS AUREUS ISOLATED NO GROUP A STREP  (S.PYOGENES) ISOLATED Performed at [facility], [address], [place], [place] [address]    LABORGA ENTEROBACTER AEROGENES 10/20/2022   LABORGA ENTEROCOCCUS FAECALIS 10/20/2022     Lab Results  Component Value Date   ALBUMIN 4.1 04/07/2022   ALBUMIN 3.8 11/24/2021   ALBUMIN 4.3 08/14/2019    No results found for: MG No results found for: VD25OH  No results found for: PREALBUMIN    Latest Ref Rng & Units 12/26/2023   10:18 AM 10/04/2023   10:38 AM 06/01/2023    2:06 PM  CBC EXTENDED  WBC 4.0 - 10.5 K/uL 7.3  7.4  7.7   RBC 3.87 - 5.11 MIL/uL 5.26  5.48  5.12   Hemoglobin 12.0 - 15.0 g/dL 86.3  85.8  86.7   HCT 36.0 - 46.0 % 41.1  44.3  40.5   Platelets 150 - 400 K/uL 329  345  321      Body mass index is 47.93 kg/m.  Orders:  No orders of the defined types were placed in this encounter.  No orders of the defined types were placed in this encounter.    Procedures: Large Joint Inj: R knee on 11/22/2024 11:13 AM Indications: pain and diagnostic evaluation Details: 25 G 1.5 in needle, anteromedial approach  Arthrogram: No  Medications: 40 mg methylPREDNISolone  acetate 40 MG/ML; 4 mL lidocaine  1 % Outcome: tolerated well, no immediate complications Procedure, treatment alternatives, risks and benefits explained, specific risks discussed. Consent was given by the patient and parent. Immediately prior to procedure a time out was called to verify the correct patient, procedure, equipment, support staff and site/side marked as required.     Clinical Data: No additional findings.  ROS:  All other systems negative, except as noted in the HPI. Review of Systems  Objective: Vital Signs: Ht 5' 6 (1.676 m)   Wt 296 lb 15.4 oz (134.7 kg)   LMP 02/09/2014   BMI 47.93 kg/m   Specialty Comments:  No specialty comments available.  PMFS History: Patient Active Problem List   Diagnosis Date Noted   Endometrial hyperplasia without atypia 12/26/2023    Postmenopausal bleeding 12/26/2023   LAP-BAND surgery status 04/06/2022   Trigger finger, left middle finger 06/16/2021   Unilateral primary osteoarthritis, right knee 06/16/2021   Obesity    High blood pressure 11/22/2020   Chronic right-sided low back pain 03/08/2019   Anxiety 09/21/2018   Stress at home 09/21/2018   Eczema 09/21/2018   Arthritis 09/21/2018   Past Medical History:  Diagnosis Date   Anxiety    Arthritis    Asthma    Depression    occasional   Diabetes mellitus without complication (HCC)    GERD (gastroesophageal reflux disease)    Hypertension    Obesity    Sleep apnea    does not wear CPAP    Family History  Problem Relation Age of Onset   Hypertension Mother    Diabetes Mother    Colon cancer Neg Hx    Colon polyps Neg Hx    Esophageal cancer Neg Hx    Rectal cancer Neg Hx    Stomach cancer Neg Hx     Past Surgical History:  Procedure Laterality Date   ectopic     HYSTEROSCOPY WITH D & C N/A 12/26/2023   Procedure: DILATATION AND CURETTAGE /HYSTEROSCOPY;  Surgeon: Jeralyn Crutch, MD;  Location: [facility] OR;  Service: Gynecology;  Laterality: N/A;   LAPAROSCOPIC GASTRIC BANDING     2009   LEEP     1994   SALPINGECTOMY     Social History   Occupational History   Not on file  Tobacco Use   Smoking status: Never   Smokeless tobacco: Never  Vaping Use   Vaping status: Never Used  Substance and Sexual Activity   Alcohol use: Yes    Comment: occasionally   Drug use: No   Sexual activity: Not Currently

## 2025-01-21 ENCOUNTER — Ambulatory Visit: Admitting: Podiatry
